# Patient Record
Sex: Female | Born: 1995 | Race: Black or African American | Hispanic: No | Marital: Single | Smoking: Never smoker
Health system: Southern US, Community
[De-identification: ages and names within clinical notes are randomized; demographics above are authoritative.]

## PROBLEM LIST (undated history)

## (undated) DIAGNOSIS — J45909 Unspecified asthma, uncomplicated: Secondary | ICD-10-CM

## (undated) HISTORY — PX: TONSILLECTOMY: SUR1361

---

## 2017-12-29 ENCOUNTER — Encounter (HOSPITAL_COMMUNITY): Payer: Self-pay

## 2017-12-29 ENCOUNTER — Other Ambulatory Visit: Payer: Self-pay

## 2017-12-29 ENCOUNTER — Emergency Department (HOSPITAL_COMMUNITY)
Admission: EM | Admit: 2017-12-29 | Discharge: 2017-12-29 | Disposition: A | Discharge status: Home / Self Care | Payer: Self-pay | Attending: Emergency Medicine | Admitting: Emergency Medicine

## 2017-12-29 ENCOUNTER — Emergency Department (HOSPITAL_COMMUNITY): Payer: Self-pay

## 2017-12-29 DIAGNOSIS — M79641 Pain in right hand: Secondary | ICD-10-CM | POA: Insufficient documentation

## 2017-12-29 DIAGNOSIS — M79642 Pain in left hand: Secondary | ICD-10-CM | POA: Insufficient documentation

## 2017-12-29 DIAGNOSIS — J45909 Unspecified asthma, uncomplicated: Secondary | ICD-10-CM | POA: Insufficient documentation

## 2017-12-29 DIAGNOSIS — Z79899 Other long term (current) drug therapy: Secondary | ICD-10-CM | POA: Insufficient documentation

## 2017-12-29 HISTORY — DX: Unspecified asthma, uncomplicated: J45.909

## 2017-12-29 MED ORDER — ACETAMINOPHEN 500 MG PO TABS
1000.0000 mg | ORAL_TABLET | Freq: Once | ORAL | Status: AC
  Administered 2017-12-29: 1000 mg via ORAL
  Filled 2017-12-29: qty 2

## 2017-12-29 NOTE — ED Notes (Signed)
ED Provider at bedside. 

## 2017-12-29 NOTE — ED Notes (Signed)
Patient verbalizes understanding of discharge instructions. Opportunity for questioning and answers were provided. Armband removed by staff, pt discharged from ED. Pt ambulatory to the lobby.  

## 2017-12-29 NOTE — ED Provider Notes (Signed)
[facility] EMERGENCY DEPARTMENT Provider Note   CSN: [id number] Arrival date & time: 12/29/17  1453     History   Chief Complaint Chief Complaint  Patient presents with  . Hand Pain    HPI Sheri Zimmerman is a 22 y.o. female for evaluation of pain in the bilateral hand that is been ongoing for the last 2 weeks.  He states that most the pain is on the dorsal aspect of hand and in her fingers.  She states that the pain is worse with movement and bending her fingers.  She is noticed some swelling about a week ago but states it is since resolved.  No overlying warmth, erythema.  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  She states that while she is working, the pain is slightly better but when she is at home and resting, she feels "aching all in her hands."  She does not know of any specific trauma, injury, fall.  She has been taking Tylenol intermittently with minimal improvement.  She states that she did not does not have a primary care doctor to follow-up with she came into the ED today because she had time.  She does report that grandmother has a history of rheumatoid arthritis.  Patient denies any fevers.  Reports she does occasionally have some presenting sensation to bilateral tips of fingers currently denies any numbness or weakness.  The history is provided by the patient.    Past Medical History:  Diagnosis Date  . Asthma     There are no active problems to display for this patient.   History reviewed. No pertinent surgical history.   OB History   None      Home Medications    Prior to Admission medications   Medication Sig Start Date End Date Taking? Authorizing Provider  acetaminophen (TYLENOL) 325 MG tablet Take 650 mg by mouth every 3 (three) hours as needed (for hand-related pain).    Yes [provider]  etonogestrel (NEXPLANON) 68 MG IMPL implant 1 each by Subdermal route once.    Yes [provider]  naproxen sodium (ALEVE) 220 MG tablet Take 440 mg by mouth 2 (two) times daily as needed (for hand-related pain).    Yes [provider]    Family History No family history on file.  Social History Social History   Tobacco Use  . Smoking status: Not on file  Substance Use Topics  . Alcohol use: Not on file  . Drug use: Not on file     Allergies   Patient has no known allergies.   Review of Systems Review of Systems  Constitutional: Negative for fever.  Musculoskeletal:       Bilateral hand pain  Skin: Negative for color change.  Neurological: Negative for weakness and numbness.  All other systems reviewed and are negative.    Physical Exam Updated Vital Signs BP 117/72 (BP Location: Right Arm)   Pulse 64   Temp 98.6 F (37 C) (Oral)   Resp 15   Ht 5\' 1"  (1.549 m)   Wt 47.6 kg   SpO2 100%   BMI 19.84 kg/m   Physical Exam  Constitutional: She appears well-developed and well-nourished.  HENT:  Head: Normocephalic and atraumatic.  Eyes: Conjunctivae and EOM are normal. Right eye exhibits no discharge. Left eye exhibits no discharge. No scleral icterus.  Cardiovascular:  Pulses:      Radial  pulses are 2+ on the right side, and 2+ on the left side.  Pulmonary/Chest: Effort normal.  Musculoskeletal:  Diffuse tenderness palpation dorsal aspect of the right hand.  No deformity or crepitus noted.  No overlying warmth, erythema, edema.  Flexion/extension of all 5 digits intact without any difficulty.  Additionally can easily make a fist.  Flexion/extension of right wrist intact without any difficulty.  No tenderness palpation of the right wrist, right elbow, right shoulder.  Diffuse tenderness palpation of the dorsal aspect of the left hand.  No deformity or crepitus noted.  No overlying warmth, erythema, edema.  Flexion/extension of all 5 digits intact without any difficulty.  Patient can usually make a fist without any  difficulty.  Flexion/extension of left wrist intact without any difficulty.  No tenderness palpation of the wrist, left elbow, left shoulder. Positive Phalen's test. Negative Tinnel's sign.   Neurological: She is alert.  Equal grip strength bilaterally. 5/5 strength of BUE  Skin: Skin is warm and dry. Capillary refill takes less than 2 seconds.  Good distal cap refill. BUE are not dusky in appearance or cool to touch.  Psychiatric: She has a normal mood and affect. Her speech is normal and behavior is normal.  Nursing note and vitals reviewed.    ED Treatments / Results  Labs (all labs ordered are listed, but only abnormal results are displayed) Labs Reviewed - No data to display  EKG None  Radiology Dg Hand Complete Left  Result Date: 12/29/2017 CLINICAL DATA:  Hand pain for 2 weeks.  No known trauma. EXAM: LEFT HAND - COMPLETE 3+ VIEW COMPARISON:  None. FINDINGS: There is no evidence of fracture or dislocation. There is no evidence of arthropathy or other focal bone abnormality. Soft tissues are unremarkable. IMPRESSION: Negative. Electronically Signed   By: Signa Kell M.D.   On: 12/29/2017 16:18   Dg Hand Complete Right  Result Date: 12/29/2017 CLINICAL DATA:  Hand pain. EXAM: RIGHT HAND - COMPLETE 3+ VIEW COMPARISON:  No prior FINDINGS: No acute bony or joint abnormality identified. No evidence of inflammatory arthropathy. No evidence of fracture or dislocation. IMPRESSION: No acute abnormality. Electronically Signed   By: Maisie Fus  Register   On: 12/29/2017 16:19    Procedures Procedures (including critical care time)  Medications Ordered in ED Medications  acetaminophen (TYLENOL) tablet 1,000 mg (1,000 mg Oral Given 12/29/17 1523)     Initial Impression / Assessment and Plan / ED Course  I have reviewed the triage vital signs and the nursing notes.  Pertinent labs & imaging results that were available during my care of the patient were reviewed by me and considered  in my medical decision making (see chart for details).     22 year old female who presents for evaluation of 2 weeks of bilateral hand pain that she describes as a "aching."  Adamantly reports some tingling sensation to the tips of her fingers but currently denies any numbness/weakness.  No overlying warmth, erythema.  Grandmother does have a history of rheumatoid arthritis.  Patient does not know she has any immune diseases.  No fevers. Patient is afebrile, non-toxic appearing, sitting comfortably on examination table. Vital signs reviewed and stable. Patient is neurovascularly intact.  Consider generalized myalgias msk pain vs carpal tunnel versus autoimmune etiology.  History/physical exam is not concerning for septic arthritis, acute arterial embolism, flexor tenosynovitis. Check x-rays for any bony abnormality.  XRs reviewed. Negative for any acute abnormality.   Discussed results with patient. Given positive phalen's sign,  will plan to treat as possible carpal tunnel. Patient placed in bilateral wrist splints. Additionally, referred patient to outpatient [facility] for established primary care. Instructed her to have them test for autoimmune given family history of RA. At this time, patient exhibits no emergent life-threatening condition that require further evaluation in ED. Patient had ample opportunity for questions and discussion. All patient's questions were answered with full understanding. Strict return precautions discussed. Patient expresses understanding and agreement to plan.    Final Clinical Impressions(s) / ED Diagnoses   Final diagnoses:  Bilateral hand pain    ED Discharge Orders    None       Rosana HoesLayden, Lindsey A, PA-C 12/29/17 1650    Gwyneth SproutPlunkett, Whitney, MD 12/30/17 1109

## 2017-12-29 NOTE — ED Triage Notes (Signed)
Pt endorses bilateral hand pain, worse in right x2 weeks. Pt works in [employer]. Pt denies injury. Pt states pain is 8/10 and worse in morning and night.

## 2017-12-29 NOTE — ED Notes (Signed)
Pt back from X-ray.  

## 2017-12-29 NOTE — ED Notes (Signed)
Patient transported to X-ray 

## 2017-12-29 NOTE — Discharge Instructions (Signed)
You can take Tylenol or Ibuprofen as directed for pain. You can alternate Tylenol and Ibuprofen every 4 hours. If you take Tylenol at 1pm, then you can take Ibuprofen at 5pm. Then you can take Tylenol again at 9pm.   Wear the splint for support and stabilization. Wear them especially work.  Follow-up with the referred [facility] doctor.  Return to the emergency department for worsening pain, fevers, discoloration of the fingertips or any

## 2018-04-12 ENCOUNTER — Ambulatory Visit: Payer: Self-pay | Admitting: Nurse Practitioner

## 2019-02-18 ENCOUNTER — Emergency Department (HOSPITAL_COMMUNITY)
Admission: EM | Admit: 2019-02-18 | Discharge: 2019-02-18 | Disposition: A | Discharge status: Home / Self Care | Payer: BC Managed Care – PPO | Attending: Emergency Medicine | Admitting: Emergency Medicine

## 2019-02-18 ENCOUNTER — Emergency Department (HOSPITAL_COMMUNITY): Payer: BC Managed Care – PPO

## 2019-02-18 ENCOUNTER — Other Ambulatory Visit: Payer: Self-pay

## 2019-02-18 ENCOUNTER — Encounter (HOSPITAL_COMMUNITY): Payer: Self-pay

## 2019-02-18 DIAGNOSIS — Y92410 Unspecified street and highway as the place of occurrence of the external cause: Secondary | ICD-10-CM | POA: Insufficient documentation

## 2019-02-18 DIAGNOSIS — J45909 Unspecified asthma, uncomplicated: Secondary | ICD-10-CM | POA: Diagnosis not present

## 2019-02-18 DIAGNOSIS — Y9389 Activity, other specified: Secondary | ICD-10-CM | POA: Insufficient documentation

## 2019-02-18 DIAGNOSIS — M21822 Other specified acquired deformities of left upper arm: Secondary | ICD-10-CM | POA: Diagnosis not present

## 2019-02-18 DIAGNOSIS — Y999 Unspecified external cause status: Secondary | ICD-10-CM | POA: Insufficient documentation

## 2019-02-18 DIAGNOSIS — M25512 Pain in left shoulder: Secondary | ICD-10-CM | POA: Diagnosis present

## 2019-02-18 DIAGNOSIS — Z79899 Other long term (current) drug therapy: Secondary | ICD-10-CM | POA: Insufficient documentation

## 2019-02-18 MED ORDER — METHOCARBAMOL 500 MG PO TABS: 500.0000 mg | ORAL_TABLET | Freq: Two times a day (BID) | ORAL | 0 refills | Status: DC
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
## 2021-07-16 ENCOUNTER — Ambulatory Visit
Admission: RE | Admit: 2021-07-16 | Discharge: 2021-07-16 | Disposition: A | Discharge status: Home / Self Care | Payer: BC Managed Care – PPO | Source: Ambulatory Visit | Attending: Physician Assistant | Admitting: Physician Assistant

## 2021-07-16 VITALS — BP 109/73 | HR 75 | Temp 98.4°F | Resp 16

## 2021-07-16 DIAGNOSIS — A5901 Trichomonal vulvovaginitis: Secondary | ICD-10-CM | POA: Diagnosis not present

## 2021-07-16 DIAGNOSIS — T192XXA Foreign body in vulva and vagina, initial encounter: Secondary | ICD-10-CM | POA: Insufficient documentation

## 2021-07-16 DIAGNOSIS — N760 Acute vaginitis: Secondary | ICD-10-CM | POA: Insufficient documentation

## 2021-07-16 MED ORDER — ALBUTEROL SULFATE HFA 108 (90 BASE) MCG/ACT IN AERS: 2.0000 | INHALATION_SPRAY | Freq: Four times a day (QID) | RESPIRATORY_TRACT | 2 refills | Status: AC | PRN

## 2021-07-16 NOTE — ED Provider Notes (Addendum)
[facility]    CSN: [id number] Arrival date & time: 07/16/21  5625      History   Chief Complaint Chief Complaint  Patient presents with   SEXUALLY TRANSMITTED DISEASE    Entered by patient    HPI Sheri Zimmerman is a 26 y.o. female.   Pt complains of a condom stick in vaginal vault.  Pt also request std testing.  No symptoms   The history is provided by the patient. No language interpreter was used.  Foreign Body in Vagina This is a new problem. The current episode started yesterday. The problem has not changed since onset.Pertinent negatives include no abdominal pain. She has tried nothing for the symptoms. The treatment provided no relief.    Past Medical History:  Diagnosis Date   Asthma     There are no problems to display for this patient.   Past Surgical History:  Procedure Laterality Date   TONSILLECTOMY      OB History     Gravida  0   Para  0   Term  0   Preterm  0   AB  0   Living  0      SAB  0   IAB  0   Ectopic  0   Multiple  0   Live Births  0            Home Medications    Prior to Admission medications   Medication Sig Start Date End Date Taking? Authorizing Provider  acetaminophen (TYLENOL) 325 MG tablet Take 650 mg by mouth every 3 (three) hours as needed (for hand-related pain).     [provider]  etonogestrel (NEXPLANON) 68 MG IMPL implant 1 each by Subdermal route once.    [provider]  methocarbamol (ROBAXIN) 500 MG tablet Take 1 tablet (500 mg total) by mouth 2 (two) times daily. 02/18/19   Khatri, Hina, PA-C  naproxen sodium (ALEVE) 220 MG tablet Take 440 mg by mouth 2 (two) times daily as needed (for hand-related pain).     [provider]    Family History History reviewed. No pertinent family history.  Social History Social History   Tobacco Use   Smoking status: Former    Types: Cigarettes   Smokeless tobacco: Never  Vaping Use   Vaping Use: Never used   Substance Use Topics   Alcohol use: Yes    Comment: ocassionally   Drug use: Never     Allergies   Patient has no known allergies.   Review of Systems Review of Systems  Gastrointestinal:  Negative for abdominal pain.  Genitourinary:  Negative for vaginal discharge and vaginal pain.  All other systems reviewed and are negative.    Physical Exam Triage Vital Signs ED Triage Vitals [07/16/21 0847]  Enc Vitals Group     BP 109/73     Pulse Rate 75     Resp 16     Temp 98.4 F (36.9 C)     Temp Source Oral     SpO2 95 %     Weight      Height      Head Circumference      Peak Flow      Pain Score 0     Pain Loc      Pain Edu?      Excl. in GC?    No data found.  Updated Vital Signs BP 109/73 (BP Location: Right Arm)  Pulse 75   Temp 98.4 F (36.9 C) (Oral)   Resp 16   LMP 07/02/2021   SpO2 95%   Visual Acuity Right Eye Distance:   Left Eye Distance:   Bilateral Distance:    Right Eye Near:   Left Eye Near:    Bilateral Near:     Physical Exam Vitals and nursing note reviewed.  Constitutional:      Appearance: She is well-developed.  Eyes:     Pupils: Pupils are equal, round, and reactive to light.  Cardiovascular:     Rate and Rhythm: Normal rate.  Pulmonary:     Effort: Pulmonary effort is normal.  Abdominal:     General: There is no distension.  Genitourinary:    Comments: Condom vaginal vault, removed with speculem and fox swab.  Neurological:     Mental Status: She is alert and oriented to person, place, and time.  Psychiatric:        Mood and Affect: Mood normal.      UC Treatments / Results  Labs (all labs ordered are listed, but only abnormal results are displayed) Labs Reviewed  CERVICOVAGINAL ANCILLARY ONLY    EKG   Radiology No results found.  Procedures Procedures (including critical care time)  Medications Ordered in UC Medications - No data to display  Initial Impression / Assessment and Plan / UC Course   I have reviewed the triage vital signs and the nursing notes.  Pertinent labs & imaging results that were available during my care of the patient were reviewed by me and considered in my medical decision making (see chart for details). Pt request an albuterol inhaler.  Final Clinical Impressions(s) / UC Diagnoses   Final diagnoses:  Retained foreign body of vagina, initial encounter     Discharge Instructions      Return if any problems.    ED Prescriptions   None    PDMP not reviewed this encounter.   Elson Areas, PA-C 07/16/21 9292    Elson Areas, PA-C 07/16/21 1126

## 2021-07-16 NOTE — Discharge Instructions (Addendum)
Return if any problems.

## 2021-07-16 NOTE — ED Triage Notes (Signed)
Pt states that after intercourse last pm they could not find the condom. Pt states she also wants to be checked for STD. Denies discharge.

## 2021-07-20 LAB — CERVICOVAGINAL ANCILLARY ONLY
Bacterial Vaginitis (gardnerella): POSITIVE — AB
Candida Glabrata: NEGATIVE
Candida Vaginitis: NEGATIVE
Chlamydia: NEGATIVE
Comment: NEGATIVE
Comment: NEGATIVE
Comment: NEGATIVE
Comment: NEGATIVE
Comment: NEGATIVE
Comment: NORMAL
Neisseria Gonorrhea: NEGATIVE
Trichomonas: POSITIVE — AB

## 2021-07-21 ENCOUNTER — Telehealth (HOSPITAL_COMMUNITY): Payer: Self-pay | Admitting: Emergency Medicine

## 2021-07-21 ENCOUNTER — Telehealth: Payer: Self-pay

## 2021-07-21 MED ORDER — METRONIDAZOLE 500 MG PO TABS: 500.0000 mg | ORAL_TABLET | Freq: Two times a day (BID) | ORAL | 0 refills | Status: DC

## 2021-07-21 NOTE — ED Notes (Signed)
Patient verification complete (name and date of birth).  Patient in clinic requesting to get medication prescribed today sent to another pharmacy. Medication sent and the patient is aware.

## 2021-08-10 ENCOUNTER — Ambulatory Visit
Admission: RE | Admit: 2021-08-10 | Discharge: 2021-08-10 | Disposition: A | Discharge status: Home / Self Care | Payer: BC Managed Care – PPO | Source: Ambulatory Visit | Attending: Urgent Care | Admitting: Urgent Care

## 2021-08-10 VITALS — BP 115/72 | HR 76 | Temp 98.6°F | Resp 20

## 2021-08-10 DIAGNOSIS — Z113 Encounter for screening for infections with a predominantly sexual mode of transmission: Secondary | ICD-10-CM | POA: Insufficient documentation

## 2021-08-10 DIAGNOSIS — Z8619 Personal history of other infectious and parasitic diseases: Secondary | ICD-10-CM | POA: Insufficient documentation

## 2021-08-10 DIAGNOSIS — Z8742 Personal history of other diseases of the female genital tract: Secondary | ICD-10-CM | POA: Insufficient documentation

## 2021-08-10 NOTE — ED Provider Notes (Signed)
[facility]   MRN: [id number] DOB: 1995-06-23  Subjective:   Sheri Zimmerman is a 26 y.o. female presenting for retesting for BV and trichomoniasis.  Patient was treated in early June for this.  She completed her antibiotic course more than 2 weeks ago.  Denies fever, nausea, vomiting, abdominal or pelvic pain, vaginal discharge, urinary symptoms.  She simply wants to make sure she does not have these infections still.  She did complete the course of metronidazole.  No current facility-administered medications for this encounter.  Current Outpatient Medications:    acetaminophen (TYLENOL) 325 MG tablet, Take 650 mg by mouth every 3 (three) hours as needed (for hand-related pain). , Disp: , Rfl:    albuterol (VENTOLIN HFA) 108 (90 Base) MCG/ACT inhaler, Inhale 2 puffs into the lungs every 6 (six) hours as needed for wheezing or shortness of breath., Disp: 8 g, Rfl: 2   etonogestrel (NEXPLANON) 68 MG IMPL implant, 1 each by Subdermal route once., Disp: , Rfl:    methocarbamol (ROBAXIN) 500 MG tablet, Take 1 tablet (500 mg total) by mouth 2 (two) times daily., Disp: 20 tablet, Rfl: 0   metroNIDAZOLE (FLAGYL) 500 MG tablet, Take 1 tablet (500 mg total) by mouth 2 (two) times daily., Disp: 14 tablet, Rfl: 0   naproxen sodium (ALEVE) 220 MG tablet, Take 440 mg by mouth 2 (two) times daily as needed (for hand-related pain). , Disp: , Rfl:    No Known Allergies  Past Medical History:  Diagnosis Date   Asthma      Past Surgical History:  Procedure Laterality Date   TONSILLECTOMY      History reviewed. No pertinent family history.  Social History   Tobacco Use   Smoking status: Former    Types: Cigarettes   Smokeless tobacco: Never  Vaping Use   Vaping Use: Never used  Substance Use Topics   Alcohol use: Yes    Comment: ocassionally   Drug use: Never    ROS   Objective:   Vitals: BP 115/72   Pulse 76   Temp 98.6 F (37 C)   Resp 20   LMP  07/02/2021   SpO2 98%   Physical Exam Constitutional:      General: She is not in acute distress.    Appearance: Normal appearance. She is well-developed. She is not ill-appearing, toxic-appearing or diaphoretic.  HENT:     Head: Normocephalic and atraumatic.     Nose: Nose normal.     Mouth/Throat:     Mouth: Mucous membranes are moist.  Eyes:     General: No scleral icterus.       Right eye: No discharge.        Left eye: No discharge.     Extraocular Movements: Extraocular movements intact.  Cardiovascular:     Rate and Rhythm: Normal rate.  Pulmonary:     Effort: Pulmonary effort is normal.  Skin:    General: Skin is warm and dry.  Neurological:     General: No focal deficit present.     Mental Status: She is alert and oriented to person, place, and time.  Psychiatric:        Mood and Affect: Mood normal.        Behavior: Behavior normal.     Assessment and Plan :   PDMP not reviewed this encounter.  1. History of trichomoniasis   2. History of vaginitis   3. Screen for STD (sexually transmitted disease)  Recheck for trichomoniasis and bacterial vaginosis pending.  We will treat as appropriate based off of lab results.   Wallis Bamberg, PA-C 08/10/21 1026

## 2021-08-10 NOTE — ED Triage Notes (Addendum)
Pt here for follow-up testing of BV and trich after receiving treatment 3 weeks ago. No sxs.

## 2021-08-12 ENCOUNTER — Telehealth (HOSPITAL_COMMUNITY): Payer: Self-pay | Admitting: Emergency Medicine

## 2021-08-12 LAB — CERVICOVAGINAL ANCILLARY ONLY
Bacterial Vaginitis (gardnerella): NEGATIVE
Chlamydia: POSITIVE — AB
Comment: NEGATIVE
Comment: NEGATIVE
Comment: NEGATIVE
Comment: NORMAL
Neisseria Gonorrhea: NEGATIVE
Trichomonas: NEGATIVE

## 2021-08-12 MED ORDER — DOXYCYCLINE HYCLATE 100 MG PO CAPS: 100.0000 mg | ORAL_CAPSULE | Freq: Two times a day (BID) | ORAL | 0 refills | Status: AC

## 2021-09-25 ENCOUNTER — Ambulatory Visit: Payer: BC Managed Care – PPO

## 2021-11-19 ENCOUNTER — Ambulatory Visit
Admission: RE | Admit: 2021-11-19 | Discharge: 2021-11-19 | Disposition: A | Discharge status: Home / Self Care | Payer: BC Managed Care – PPO | Source: Ambulatory Visit | Attending: Emergency Medicine | Admitting: Emergency Medicine

## 2021-11-19 VITALS — BP 123/84 | HR 65 | Temp 98.2°F | Resp 16

## 2021-11-19 DIAGNOSIS — Z202 Contact with and (suspected) exposure to infections with a predominantly sexual mode of transmission: Secondary | ICD-10-CM | POA: Diagnosis not present

## 2021-11-19 DIAGNOSIS — N760 Acute vaginitis: Secondary | ICD-10-CM | POA: Diagnosis not present

## 2021-11-19 DIAGNOSIS — Z113 Encounter for screening for infections with a predominantly sexual mode of transmission: Secondary | ICD-10-CM | POA: Diagnosis present

## 2021-11-19 LAB — POCT URINE PREGNANCY: Preg Test, Ur: NEGATIVE

## 2021-11-19 NOTE — Discharge Instructions (Signed)
The results of your vaginal swab test which screens for BV, yeast, gonorrhea, chlamydia and trichomonas will be made posted to your MyChart account once it is complete.  This typically takes 2 to 4 days.  Please abstain from sexual intercourse of any kind, vaginal, oral or anal, until you have received the results of your STD testing.     If any of your results are abnormal, you will receive a phone call regarding treatment.  Prescriptions, if any are needed, will be provided for you at your pharmacy.     Your urine pregnancy test today is negative.   Thank you for visiting urgent care today.  I appreciate the opportunity to participate in your care.

## 2021-11-19 NOTE — ED Provider Notes (Signed)
[facility]    CSN: [id number] Arrival date & time: 11/19/21  1619    HISTORY   Chief Complaint  Patient presents with   SEXUALLY TRANSMITTED DISEASE    Entered by patient   HPI Sheri Zimmerman is a pleasant, 26 y.o. female who presents to urgent care today. The patient is requesting to be tested for STDs.  Patient endorses abnormal vaginal odor.  Patient denies abnormal odor of urine, burning with urination, increased frequency of urination, suprapubic pain, perineal pain, flank pain, fever, chills, malaise, rigors, significant fatigue, vaginal itching, vaginal irritation, dyspareunia, genital lesion(s), known exposure to STD, and possible exposure to STD.       Past Medical History:  Diagnosis Date   Asthma    There are no problems to display for this patient.  Past Surgical History:  Procedure Laterality Date   TONSILLECTOMY     OB History     Gravida  0   Para  0   Term  0   Preterm  0   AB  0   Living  0      SAB  0   IAB  0   Ectopic  0   Multiple  0   Live Births  0          Home Medications    Prior to Admission medications   Medication Sig Start Date End Date Taking? Authorizing Provider  acetaminophen (TYLENOL) 325 MG tablet Take 650 mg by mouth every 3 (three) hours as needed (for hand-related pain).     [provider]  albuterol (VENTOLIN HFA) 108 (90 Base) MCG/ACT inhaler Inhale 2 puffs into the lungs every 6 (six) hours as needed for wheezing or shortness of breath. 07/16/21   Elson Areas, PA-C  naproxen sodium (ALEVE) 220 MG tablet Take 440 mg by mouth 2 (two) times daily as needed (for hand-related pain).     [provider]    Family History History reviewed. No pertinent family history. Social History Social History   Tobacco Use   Smoking status: Former    Types: Cigarettes   Smokeless tobacco: Never  Vaping Use   Vaping Use: Never used  Substance Use Topics   Alcohol use: Yes     Comment: ocassionally   Drug use: Never   Allergies   Patient has no known allergies.  Review of Systems Review of Systems Pertinent findings revealed after performing a 14 point review of systems has been noted in the history of present illness.  Physical Exam Triage Vital Signs ED Triage Vitals  Enc Vitals Group     BP 12/05/20 0827 (!) 147/82     Pulse Rate 12/05/20 0827 72     Resp 12/05/20 0827 18     Temp 12/05/20 0827 98.3 F (36.8 C)     Temp Source 12/05/20 0827 Oral     SpO2 12/05/20 0827 98 %     Weight --      Height --      Head Circumference --      Peak Flow --      Pain Score 12/05/20 0826 5     Pain Loc --      Pain Edu? --      Excl. in GC? --   No data found.  Updated Vital Signs BP 123/84 (BP Location: Right Arm)   Pulse 65   Temp 98.2 F (36.8 C) (Oral)   Resp 16   LMP  11/06/2021   SpO2 98%   Physical Exam Vitals and nursing note reviewed.  Constitutional:      General: She is not in acute distress.    Appearance: Normal appearance. She is not ill-appearing.  HENT:     Head: Normocephalic and atraumatic.  Eyes:     General: Lids are normal.        Right eye: No discharge.        Left eye: No discharge.     Extraocular Movements: Extraocular movements intact.     Conjunctiva/sclera: Conjunctivae normal.     Right eye: Right conjunctiva is not injected.     Left eye: Left conjunctiva is not injected.  Neck:     Trachea: Trachea and phonation normal.  Cardiovascular:     Rate and Rhythm: Normal rate and regular rhythm.     Pulses: Normal pulses.     Heart sounds: Normal heart sounds. No murmur heard.    No friction rub. No gallop.  Pulmonary:     Effort: Pulmonary effort is normal. No accessory muscle usage, prolonged expiration or respiratory distress.     Breath sounds: Normal breath sounds. No stridor, decreased air movement or transmitted upper airway sounds. No decreased breath sounds, wheezing, rhonchi or rales.  Chest:      Chest wall: No tenderness.  Genitourinary:    Comments: Patient politely declines pelvic exam today, patient provided a vaginal swab for testing. Musculoskeletal:        General: Normal range of motion.     Cervical back: Normal range of motion and neck supple. Normal range of motion.  Lymphadenopathy:     Cervical: No cervical adenopathy.  Skin:    General: Skin is warm and dry.     Findings: No erythema or rash.  Neurological:     General: No focal deficit present.     Mental Status: She is alert and oriented to person, place, and time.  Psychiatric:        Mood and Affect: Mood normal.        Behavior: Behavior normal.     Visual Acuity Right Eye Distance:   Left Eye Distance:   Bilateral Distance:    Right Eye Near:   Left Eye Near:    Bilateral Near:     [facility] Couse / Diagnostics / Procedures:     Radiology No results found.  Procedures Procedures (including critical care time) EKG  Pending results:  Labs Reviewed  POCT URINE PREGNANCY  CERVICOVAGINAL ANCILLARY ONLY    Medications Ordered in UC: Medications - No data to display  UC Diagnoses / Final Clinical Impressions(s)   I have reviewed the triage vital signs and the nursing notes.  Pertinent labs & imaging results that were available during my care of the patient were reviewed by me and considered in my medical decision making (see chart for details).    Final diagnoses:  Screening examination for STD (sexually transmitted disease)    STD screening was performed, patient advised that the results be posted to their MyChart and if any of the results are positive, they will be notified by phone, further treatment will be provided as indicated based on results of STD screening. Patient was advised to abstain from sexual intercourse until that they receive the results of their STD testing.  Patient was also advised to use condoms to protect themselves from STD exposure. Urine pregnancy test was  negative. Return precautions advised.  Drug allergies reviewed, all questions addressed.  ED Prescriptions   None    PDMP not reviewed this encounter.  Disposition Upon Discharge:  Condition: stable for discharge home  Patient presented with concern for an acute illness with associated systemic symptoms and significant discomfort requiring urgent management. In my opinion, this is a condition that a prudent lay person (someone who possesses an average knowledge of health and medicine) may potentially expect to result in complications if not addressed urgently such as respiratory distress, impairment of bodily function or dysfunction of bodily organs.   As such, the patient has been evaluated and assessed, work-up was performed and treatment was provided in alignment with urgent care protocols and evidence based medicine.  Patient/parent/caregiver has been advised that the patient may require follow up for further testing and/or treatment if the symptoms continue in spite of treatment, as clinically indicated and appropriate.  Routine symptom specific, illness specific and/or disease specific instructions were discussed with the patient and/or caregiver at length.  Prevention strategies for avoiding STD exposure were also discussed.  The patient will follow up with their current PCP if and as advised. If the patient does not currently have a PCP we will assist them in obtaining one.   The patient may need specialty follow up if the symptoms continue, in spite of conservative treatment and management, for further workup, evaluation, consultation and treatment as clinically indicated and appropriate.  Patient/parent/caregiver verbalized understanding and agreement of plan as discussed.  All questions were addressed during visit.  Please see discharge instructions below for further details of plan.  Discharge Instructions:   Discharge Instructions      The results of your vaginal swab  test which screens for BV, yeast, gonorrhea, chlamydia and trichomonas will be made posted to your MyChart account once it is complete.  This typically takes 2 to 4 days.  Please abstain from sexual intercourse of any kind, vaginal, oral or anal, until you have received the results of your STD testing.     If any of your results are abnormal, you will receive a phone call regarding treatment.  Prescriptions, if any are needed, will be provided for you at your pharmacy.     Your urine pregnancy test today is negative.   Thank you for visiting urgent care today.  I appreciate the opportunity to participate in your care.       This office note has been dictated using Teaching laboratory technician.  Unfortunately, this method of dictation can sometimes lead to typographical or grammatical errors.  I apologize for your inconvenience in advance if this occurs.  Please do not hesitate to reach out to me if clarification is needed.       Theadora Rama Scales, PA-C 11/19/21 1649

## 2021-11-19 NOTE — ED Triage Notes (Signed)
The patient is requesting to be tested for STDs, she states she does have a vaginal odor that began this week.

## 2021-11-20 LAB — CERVICOVAGINAL ANCILLARY ONLY
Bacterial Vaginitis (gardnerella): POSITIVE — AB
Candida Glabrata: NEGATIVE
Candida Vaginitis: NEGATIVE
Chlamydia: NEGATIVE
Comment: NEGATIVE
Comment: NEGATIVE
Comment: NEGATIVE
Comment: NEGATIVE
Comment: NEGATIVE
Comment: NORMAL
Neisseria Gonorrhea: NEGATIVE
Trichomonas: NEGATIVE

## 2021-11-23 ENCOUNTER — Telehealth (HOSPITAL_COMMUNITY): Payer: Self-pay

## 2021-11-23 MED ORDER — METRONIDAZOLE 500 MG PO TABS: 500.0000 mg | ORAL_TABLET | Freq: Two times a day (BID) | ORAL | 0 refills | Status: DC

## 2022-04-05 ENCOUNTER — Ambulatory Visit
Admission: EM | Admit: 2022-04-05 | Discharge: 2022-04-05 | Disposition: A | Discharge status: Home / Self Care | Payer: BC Managed Care – PPO | Attending: Urgent Care | Admitting: Urgent Care

## 2022-04-05 DIAGNOSIS — B9689 Other specified bacterial agents as the cause of diseases classified elsewhere: Secondary | ICD-10-CM | POA: Insufficient documentation

## 2022-04-05 DIAGNOSIS — B3731 Acute candidiasis of vulva and vagina: Secondary | ICD-10-CM | POA: Diagnosis not present

## 2022-04-05 DIAGNOSIS — N898 Other specified noninflammatory disorders of vagina: Secondary | ICD-10-CM | POA: Diagnosis not present

## 2022-04-05 DIAGNOSIS — Z113 Encounter for screening for infections with a predominantly sexual mode of transmission: Secondary | ICD-10-CM | POA: Insufficient documentation

## 2022-04-05 DIAGNOSIS — N760 Acute vaginitis: Secondary | ICD-10-CM | POA: Insufficient documentation

## 2022-04-05 DIAGNOSIS — M79671 Pain in right foot: Secondary | ICD-10-CM | POA: Diagnosis not present

## 2022-04-05 MED ORDER — NAPROXEN 375 MG PO TABS: 375.0000 mg | ORAL_TABLET | Freq: Two times a day (BID) | ORAL | 0 refills | Status: DC

## 2022-04-05 NOTE — ED Triage Notes (Signed)
Pt c/o pain to right foot x 4 days-denies injury-also requesting STD screen-c/o vaginal d/c that she self dx as yeast and treated with boric acid-NAD-steady gait

## 2022-04-05 NOTE — ED Provider Notes (Signed)
[facility]  Note:  This document was prepared using Systems analyst and may include unintentional dictation errors.  MRN: [id number] DOB: 1995-10-04  Subjective:   Sheri Zimmerman is a 27 y.o. female presenting for 4-day history of unprovoked right first MTP pain over the plantar surface.  No swelling, trauma, fall, redness, wounds.  Patient does a lot of walking when she is at school for physical therapy.  She has a primarily sedentary job.  Would also like to be checked for vaginal discharge.  Has had several day history of whitish discharge.  She did take some boric acid suspecting that she had a yeast infection.  This did not make a difference for her. Denies fever, n/v, abdominal pain, pelvic pain, rashes, dysuria, urinary frequency, hematuria.    No current facility-administered medications for this encounter.  Current Outpatient Medications:    acetaminophen (TYLENOL) 325 MG tablet, Take 650 mg by mouth every 3 (three) hours as needed (for hand-related pain). , Disp: , Rfl:    albuterol (VENTOLIN HFA) 108 (90 Base) MCG/ACT inhaler, Inhale 2 puffs into the lungs every 6 (six) hours as needed for wheezing or shortness of breath., Disp: 8 g, Rfl: 2   metroNIDAZOLE (FLAGYL) 500 MG tablet, Take 1 tablet (500 mg total) by mouth 2 (two) times daily., Disp: 14 tablet, Rfl: 0   naproxen sodium (ALEVE) 220 MG tablet, Take 440 mg by mouth 2 (two) times daily as needed (for hand-related pain). , Disp: , Rfl:    No Known Allergies  Past Medical History:  Diagnosis Date   Asthma      Past Surgical History:  Procedure Laterality Date   TONSILLECTOMY      No family history on file.  Social History   Tobacco Use   Smoking status: Never   Smokeless tobacco: Never  Vaping Use   Vaping Use: Never used  Substance Use Topics   Alcohol use: Yes    Comment: occ   Drug use: Never    ROS   Objective:   Vitals: BP 115/76 (BP Location:  Right Arm)   Pulse 85   Temp 98.1 F (36.7 C) (Oral)   Resp 20   LMP 03/13/2022   SpO2 96%   Physical Exam Constitutional:      General: She is not in acute distress.    Appearance: Normal appearance. She is well-developed. She is not ill-appearing, toxic-appearing or diaphoretic.  HENT:     Head: Normocephalic and atraumatic.     Nose: Nose normal.     Mouth/Throat:     Mouth: Mucous membranes are moist.     Pharynx: Oropharynx is clear.  Eyes:     General: No scleral icterus.       Right eye: No discharge.        Left eye: No discharge.     Extraocular Movements: Extraocular movements intact.     Conjunctiva/sclera: Conjunctivae normal.  Cardiovascular:     Rate and Rhythm: Normal rate.  Pulmonary:     Effort: Pulmonary effort is normal.  Abdominal:     General: Bowel sounds are normal. There is no distension.     Palpations: Abdomen is soft. There is no mass.     Tenderness: There is no abdominal tenderness. There is no right CVA tenderness, left CVA tenderness, guarding or rebound.  Musculoskeletal:       Feet:  Skin:    General: Skin is warm and dry.  Neurological:  General: No focal deficit present.     Mental Status: She is alert and oriented to person, place, and time.  Psychiatric:        Mood and Affect: Mood normal.        Behavior: Behavior normal.        Thought Content: Thought content normal.        Judgment: Judgment normal.     Assessment and Plan :   PDMP not reviewed this encounter.  1. Right foot pain   2. Vaginal discharge   3. Acute vaginitis     Will manage for inflammatory right foot pain secondary to the extensive walking she does at school.  Recommended naproxen, will have her follow-up with podiatry, referral placed.  Vaginal swab results pending, will treat as appropriate based off of those results.  Counseled patient on potential for adverse effects with medications prescribed/recommended today, ER and return-to-clinic  precautions discussed, patient verbalized understanding.    Jaynee Eagles, [place] 04/05/22 1606

## 2022-04-07 ENCOUNTER — Telehealth: Payer: Self-pay | Admitting: Emergency Medicine

## 2022-04-07 LAB — CERVICOVAGINAL ANCILLARY ONLY
Bacterial Vaginitis (gardnerella): POSITIVE — AB
Candida Glabrata: NEGATIVE
Candida Vaginitis: POSITIVE — AB
Chlamydia: NEGATIVE
Comment: NEGATIVE
Comment: NEGATIVE
Comment: NEGATIVE
Comment: NEGATIVE
Comment: NEGATIVE
Comment: NORMAL
Neisseria Gonorrhea: NEGATIVE
Trichomonas: NEGATIVE

## 2022-04-07 MED ORDER — METRONIDAZOLE 0.75 % VA GEL: 1.0000 | Freq: Every day | VAGINAL | 0 refills | Status: AC

## 2022-04-07 MED ORDER — FLUCONAZOLE 150 MG PO TABS: 150.0000 mg | ORAL_TABLET | Freq: Once | ORAL | 0 refills | Status: AC

## 2022-04-12 ENCOUNTER — Ambulatory Visit (INDEPENDENT_AMBULATORY_CARE_PROVIDER_SITE_OTHER): Payer: BC Managed Care – PPO | Admitting: Podiatry

## 2022-04-12 DIAGNOSIS — Z91199 Patient's noncompliance with other medical treatment and regimen due to unspecified reason: Secondary | ICD-10-CM

## 2022-04-12 NOTE — Progress Notes (Signed)
   Complete physical exam  Patient: Sheri Zimmerman   DOB: 11/28/1996   27 y.o. Female  MRN: [id number]  Subjective:    No chief complaint on file.   Sheri Zimmerman is a 27 y.o. female who presents today for a complete physical exam. She reports consuming a {diet types:17450} diet. {types:19826} She generally feels {DESC; WELL/FAIRLY WELL/POORLY:18703}. She reports sleeping {DESC; WELL/FAIRLY WELL/POORLY:18703}. She {does/does not:200015} have additional problems to discuss today.    Most recent fall risk assessment:    08/05/2021   10:42 AM  Fall Risk   Falls in the past year? 0  Number falls in past yr: 0  Injury with Fall? 0  Risk for fall due to : No Fall Risks  Follow up Falls evaluation completed     Most recent depression screenings:    08/05/2021   10:42 AM 06/26/2020   10:46 AM  PHQ 2/9 Scores  PHQ - 2 Score 0 0  PHQ- 9 Score 5     {VISON DENTAL STD PSA (Optional):27386}  {History (Optional):23778}  Patient Care Team: Jessup, Joy, NP as PCP - General (Nurse Practitioner)   Outpatient Medications Prior to Visit  Medication Sig   fluticasone (FLONASE) 50 MCG/ACT nasal spray Place 2 sprays into both nostrils in the morning and at bedtime. After 7 days, reduce to once daily.   norgestimate-ethinyl estradiol (SPRINTEC 28) 0.25-35 MG-MCG tablet Take 1 tablet by mouth daily.   Nystatin POWD Apply liberally to affected area 2 times per day   spironolactone (ALDACTONE) 100 MG tablet Take 1 tablet (100 mg total) by mouth daily.   No facility-administered medications prior to visit.    ROS        Objective:     There were no vitals taken for this visit. {Vitals History (Optional):23777}  Physical Exam   No results found for any visits on 09/10/21. {Show previous labs (optional):23779}    Assessment & Plan:    Routine Health Maintenance and Physical Exam  Immunization History  Administered Date(s) Administered   DTaP 02/11/1999, 04/09/1999,  06/18/1999, 03/03/2000, 09/17/2003   Hepatitis A 07/14/2007, 07/19/2008   Hepatitis B 11/29/1998, 01/06/1999, 06/18/1999   HiB (PRP-OMP) 02/11/1999, 04/09/1999, 06/18/1999, 03/03/2000   IPV 02/11/1999, 04/09/1999, 12/07/1999, 09/17/2003   Influenza,inj,Quad PF,6+ Mos 10/19/2013   Influenza-Unspecified 01/19/2012   MMR 12/06/2000, 09/17/2003   Meningococcal Polysaccharide 07/19/2011   Pneumococcal Conjugate-13 03/03/2000   Pneumococcal-Unspecified 06/18/1999, 09/01/1999   Tdap 07/19/2011   Varicella 12/07/1999, 07/14/2007    Health Maintenance  Topic Date Due   HIV Screening  Never done   Hepatitis C Screening  Never done   INFLUENZA VACCINE  09/08/2021   PAP-Cervical Cytology Screening  09/10/2021 (Originally 11/28/2019)   PAP SMEAR-Modifier  09/10/2021 (Originally 11/28/2019)   TETANUS/TDAP  09/10/2021 (Originally 07/18/2021)   HPV VACCINES  Discontinued   COVID-19 Vaccine  Discontinued    Discussed health benefits of physical activity, and encouraged her to engage in regular exercise appropriate for her age and condition.  Problem List Items Addressed This Visit   None Visit Diagnoses     Annual physical exam    -  Primary   Cervical cancer screening       Need for Tdap vaccination          No follow-ups on file.     Joy Jessup, NP   

## 2022-09-02 ENCOUNTER — Ambulatory Visit: Payer: BC Managed Care – PPO

## 2022-09-06 ENCOUNTER — Ambulatory Visit
Admission: EM | Admit: 2022-09-06 | Discharge: 2022-09-06 | Disposition: A | Discharge status: Home / Self Care | Payer: BC Managed Care – PPO | Attending: Internal Medicine | Admitting: Internal Medicine

## 2022-09-06 DIAGNOSIS — N898 Other specified noninflammatory disorders of vagina: Secondary | ICD-10-CM | POA: Insufficient documentation

## 2022-09-06 DIAGNOSIS — Z9189 Other specified personal risk factors, not elsewhere classified: Secondary | ICD-10-CM | POA: Diagnosis present

## 2022-09-06 NOTE — ED Triage Notes (Signed)
Pt c/o vaginal d/c with odor x 4-5 days-requesting STD screening-NAD-steady gait

## 2022-09-06 NOTE — ED Provider Notes (Signed)
[facility]    CSN: [id number] Arrival date & time: 09/06/22  1628      History   Chief Complaint Chief Complaint  Patient presents with   SEXUALLY TRANSMITTED DISEASE   Vaginal Discharge    HPI Sheri Zimmerman is a 27 y.o. female.   Patient presents to urgent care for evaluation of vaginal odor and vaginal discharge that started 4-5 days. Vaginal discharge is minimal, white in color, and malodorous. No vaginal itching or rash. History of BV, states this feels similar. Sexually active with 1 female partner unprotected, unsure of any exposures to STD. Would like STD testing today. LMP 09/04/2022. Denies urinary symptoms, N/V, abdominal pain, pelvic pain, low back pain, or fever/chills. Has not attempted use of any OTC medications PTA.    Vaginal Discharge   Past Medical History:  Diagnosis Date   Asthma     There are no problems to display for this patient.   Past Surgical History:  Procedure Laterality Date   TONSILLECTOMY      OB History     Gravida  0   Para  0   Term  0   Preterm  0   AB  0   Living  0      SAB  0   IAB  0   Ectopic  0   Multiple  0   Live Births  0            Home Medications    Prior to Admission medications   Medication Sig Start Date End Date Taking? Authorizing Provider  acetaminophen (TYLENOL) 325 MG tablet Take 650 mg by mouth every 3 (three) hours as needed (for hand-related pain).     [provider]  albuterol (VENTOLIN HFA) 108 (90 Base) MCG/ACT inhaler Inhale 2 puffs into the lungs every 6 (six) hours as needed for wheezing or shortness of breath. 07/16/21   Elson Areas, PA-C  metroNIDAZOLE (FLAGYL) 500 MG tablet Take 1 tablet (500 mg total) by mouth 2 (two) times daily. 11/23/21   Merrilee Jansky, MD  naproxen (NAPROSYN) 375 MG tablet Take 1 tablet (375 mg total) by mouth 2 (two) times daily with a meal. 04/05/22   Wallis Bamberg, PA-C    Family History No family history on  file.  Social History Social History   Tobacco Use   Smoking status: Never   Smokeless tobacco: Never  Vaping Use   Vaping status: Never Used  Substance Use Topics   Alcohol use: Yes    Comment: occ   Drug use: Never     Allergies   Patient has no known allergies.   Review of Systems Review of Systems  Genitourinary:  Positive for vaginal discharge.  Per HPI   Physical Exam Triage Vital Signs ED Triage Vitals  Encounter Vitals Group     BP 09/06/22 1634 123/75     Systolic BP Percentile --      Diastolic BP Percentile --      Pulse Rate 09/06/22 1634 85     Resp 09/06/22 1634 18     Temp 09/06/22 1634 97.8 F (36.6 C)     Temp Source 09/06/22 1634 Oral     SpO2 09/06/22 1634 98 %     Weight --      Height --      Head Circumference --      Peak Flow --      Pain Score 09/06/22 1647 0  Pain Loc --      Pain Education --      Exclude from Growth Chart --    No data found.  Updated Vital Signs BP 123/75 (BP Location: Right Arm)   Pulse 85   Temp 97.8 F (36.6 C) (Oral)   Resp 18   LMP 09/04/2022   SpO2 98%   Visual Acuity Right Eye Distance:   Left Eye Distance:   Bilateral Distance:    Right Eye Near:   Left Eye Near:    Bilateral Near:     Physical Exam Vitals and nursing note reviewed.  Constitutional:      Appearance: She is not ill-appearing or toxic-appearing.  HENT:     Head: Normocephalic and atraumatic.     Right Ear: Hearing and external ear normal.     Left Ear: Hearing and external ear normal.     Nose: Nose normal.     Mouth/Throat:     Lips: Pink.  Eyes:     General: Lids are normal. Vision grossly intact. Gaze aligned appropriately.     Extraocular Movements: Extraocular movements intact.     Conjunctiva/sclera: Conjunctivae normal.  Pulmonary:     Effort: Pulmonary effort is normal.  Genitourinary:    Comments: Deferred. Musculoskeletal:     Cervical back: Neck supple.  Skin:    General: Skin is warm and dry.      Capillary Refill: Capillary refill takes less than 2 seconds.     Findings: No rash.  Neurological:     General: No focal deficit present.     Mental Status: She is alert and oriented to person, place, and time. Mental status is at baseline.     Cranial Nerves: No dysarthria or facial asymmetry.  Psychiatric:        Mood and Affect: Mood normal.        Speech: Speech normal.        Behavior: Behavior normal.        Thought Content: Thought content normal.        Judgment: Judgment normal.      UC Treatments / Results  Labs (all labs ordered are listed, but only abnormal results are displayed) Labs Reviewed  HIV ANTIBODY (ROUTINE TESTING W REFLEX)  RPR  CERVICOVAGINAL ANCILLARY ONLY    EKG   Radiology No results found.  Procedures Procedures (including critical care time)  Medications Ordered in UC Medications - No data to display  Initial Impression / Assessment and Plan / UC Course  I have reviewed the triage vital signs and the nursing notes.  Pertinent labs & imaging results that were available during my care of the patient were reviewed by me and considered in my medical decision making (see chart for details).   1.  At risk for transmitted infection due to unprotected sex, vaginal discharge STI labs pending.  Patient agrees to HIV and syphilis testing today.  Will notify patient of positive results and treat accordingly when labs come back.  Patient to avoid sexual intercourse until screening testing comes back.  Education provided regarding safe sexual practices and patient encouraged to use protection to prevent spread of STIs.   Counseled patient on potential for adverse effects with medications prescribed/recommended today, strict ER and return-to-clinic precautions discussed, patient verbalized understanding.    Final Clinical Impressions(s) / UC Diagnoses   Final diagnoses:  At risk for sexually transmitted infection due to unprotected sex  Vaginal  discharge     Discharge  Instructions      Your STD testing has been sent to the lab and will come back in the next 2 to 3 days.  We will call you if any of your results are positive requiring treatment and treat you at that time.   If you do not receive a phone call from [place], this means your testing was negative.  Avoid sexual intercourse until your STD results come back.  If any of your STD results are positive, you will need to avoid sexual intercourse for 7 days while you are being treated to prevent spread of STD.  Condom use is the best way to prevent spread of STDs.  Return to urgent care as needed.      ED Prescriptions   None    PDMP not reviewed this encounter.   Carlisle Beers, [place] 09/06/22 1701

## 2022-09-06 NOTE — Discharge Instructions (Signed)
Your STD testing has been sent to the lab and will come back in the next 2 to 3 days.  We will call you if any of your results are positive requiring treatment and treat you at that time.   If you do not receive a phone call from us, this means your testing was negative.  Avoid sexual intercourse until your STD results come back.  If any of your STD results are positive, you will need to avoid sexual intercourse for 7 days while you are being treated to prevent spread of STD.  Condom use is the best way to prevent spread of STDs.  Return to urgent care as needed.  

## 2022-09-08 ENCOUNTER — Telehealth (HOSPITAL_COMMUNITY): Payer: Self-pay | Admitting: Emergency Medicine

## 2022-09-08 MED ORDER — METRONIDAZOLE 500 MG PO TABS: 500.0000 mg | ORAL_TABLET | Freq: Two times a day (BID) | ORAL | 0 refills | Status: DC

## 2022-12-15 DIAGNOSIS — J45909 Unspecified asthma, uncomplicated: Secondary | ICD-10-CM | POA: Insufficient documentation

## 2023-02-08 ENCOUNTER — Ambulatory Visit
Admission: EM | Admit: 2023-02-08 | Discharge: 2023-02-08 | Disposition: A | Discharge status: Home / Self Care | Payer: BC Managed Care – PPO | Attending: Family Medicine | Admitting: Family Medicine

## 2023-02-08 DIAGNOSIS — R300 Dysuria: Secondary | ICD-10-CM | POA: Insufficient documentation

## 2023-02-08 DIAGNOSIS — H01132 Eczematous dermatitis of right lower eyelid: Secondary | ICD-10-CM | POA: Insufficient documentation

## 2023-02-08 DIAGNOSIS — H01131 Eczematous dermatitis of right upper eyelid: Secondary | ICD-10-CM | POA: Insufficient documentation

## 2023-02-08 DIAGNOSIS — H01134 Eczematous dermatitis of left upper eyelid: Secondary | ICD-10-CM | POA: Insufficient documentation

## 2023-02-08 DIAGNOSIS — L209 Atopic dermatitis, unspecified: Secondary | ICD-10-CM | POA: Diagnosis not present

## 2023-02-08 DIAGNOSIS — N3000 Acute cystitis without hematuria: Secondary | ICD-10-CM | POA: Insufficient documentation

## 2023-02-08 DIAGNOSIS — H01135 Eczematous dermatitis of left lower eyelid: Secondary | ICD-10-CM | POA: Diagnosis present

## 2023-02-08 LAB — POCT URINALYSIS DIP (MANUAL ENTRY)
Bilirubin, UA: NEGATIVE
Blood, UA: NEGATIVE
Glucose, UA: NEGATIVE mg/dL
Ketones, POC UA: NEGATIVE mg/dL
Nitrite, UA: POSITIVE — AB
Protein Ur, POC: 30 mg/dL — AB
Spec Grav, UA: 1.025 (ref 1.010–1.025)
Urobilinogen, UA: 1 U/dL
pH, UA: 6.5 (ref 5.0–8.0)

## 2023-02-08 LAB — POCT URINE PREGNANCY: Preg Test, Ur: NEGATIVE

## 2023-02-08 MED ORDER — CEPHALEXIN 500 MG PO CAPS: 500.0000 mg | ORAL_CAPSULE | Freq: Two times a day (BID) | ORAL | 0 refills | Status: DC

## 2023-02-08 MED ORDER — PREDNISONE 20 MG PO TABS: ORAL_TABLET | ORAL | 0 refills | Status: DC

## 2023-02-08 MED ORDER — FLUCONAZOLE 150 MG PO TABS: 150.0000 mg | ORAL_TABLET | ORAL | 0 refills | Status: DC

## 2023-02-08 MED ORDER — PIMECROLIMUS 1 % EX CREA: TOPICAL_CREAM | CUTANEOUS | 0 refills | Status: DC

## 2023-02-08 NOTE — ED Triage Notes (Signed)
 Pt c/o rash to face x 2 weeks-states she was seen by dermatologist in the past for same with dx-states she is out of rx cream-pt c/o pressure when she voids with foul smelling urine x 2 weeks-was treated for UTI ~1 month with noncompliant abx-NAD-steady gait

## 2023-02-08 NOTE — ED Provider Notes (Signed)
 [facility]  Note:  This document was prepared using Conservation officer, historic buildings and may include unintentional dictation errors.  MRN: [id number] DOB: 1995-05-17  Subjective:   Taygen Newsome is a 27 y.o. female presenting for 2 complaints.  Has a recurrent rash over her face and eyelids.  Has previously been told she has seborrheic dermatitis.  She follows with dermatologist.  Julius to avoid irritating skin products.  Uses very bland hygiene products for gentle skin. Reports dysuria, urinary pressure, malodorous urine for 2 weeks.  Has been treated for UTI before, last episode was a month ago.   No current facility-administered medications for this encounter.  Current Outpatient Medications:    acetaminophen  (TYLENOL ) 325 MG tablet, Take 650 mg by mouth every 3 (three) hours as needed (for hand-related pain). , Disp: , Rfl:    albuterol  (VENTOLIN  HFA) 108 (90 Base) MCG/ACT inhaler, Inhale 2 puffs into the lungs every 6 (six) hours as needed for wheezing or shortness of breath., Disp: 8 g, Rfl: 2   metroNIDAZOLE  (FLAGYL ) 500 MG tablet, Take 1 tablet (500 mg total) by mouth 2 (two) times daily., Disp: 14 tablet, Rfl: 0   naproxen  (NAPROSYN ) 375 MG tablet, Take 1 tablet (375 mg total) by mouth 2 (two) times daily with a meal., Disp: 30 tablet, Rfl: 0   No Known Allergies  Past Medical History:  Diagnosis Date   Asthma      Past Surgical History:  Procedure Laterality Date   TONSILLECTOMY      No family history on file.  Social History   Tobacco Use   Smoking status: Never   Smokeless tobacco: Never  Vaping Use   Vaping status: Never Used  Substance Use Topics   Alcohol use: Not Currently   Drug use: Never    ROS   Objective:   Vitals: BP 110/75 (BP Location: Right Arm)   Pulse 66   Temp 98.8 F (37.1 C) (Oral)   Resp 16   LMP 01/12/2023   SpO2 100%   Physical Exam Constitutional:      General: She is not in acute  distress.    Appearance: Normal appearance. She is well-developed. She is not ill-appearing, toxic-appearing or diaphoretic.  HENT:     Head: Normocephalic and atraumatic.      Nose: Nose normal.     Mouth/Throat:     Mouth: Mucous membranes are moist.     Pharynx: Oropharynx is clear.  Eyes:     General: No scleral icterus.       Right eye: No discharge.        Left eye: No discharge.     Extraocular Movements: Extraocular movements intact.     Conjunctiva/sclera: Conjunctivae normal.  Cardiovascular:     Rate and Rhythm: Normal rate.  Pulmonary:     Effort: Pulmonary effort is normal.  Abdominal:     General: Bowel sounds are normal. There is no distension.     Palpations: Abdomen is soft. There is no mass.     Tenderness: There is no abdominal tenderness. There is no right CVA tenderness, left CVA tenderness, guarding or rebound.  Skin:    General: Skin is warm and dry.  Neurological:     General: No focal deficit present.     Mental Status: She is alert and oriented to person, place, and time.  Psychiatric:        Mood and Affect: Mood normal.  Behavior: Behavior normal.        Thought Content: Thought content normal.        Judgment: Judgment normal.     Results for orders placed or performed during the hospital encounter of 02/08/23 (from the past 24 hours)  POCT urinalysis dipstick     Status: Abnormal   Collection Time: 02/08/23 11:10 AM  Result Value Ref Range   Color, UA yellow yellow   Clarity, UA cloudy (A) clear   Glucose, UA negative negative mg/dL   Bilirubin, UA negative negative   Ketones, POC UA negative negative mg/dL   Spec Grav, UA 8.974 8.989 - 1.025   Blood, UA negative negative   pH, UA 6.5 5.0 - 8.0   Protein Ur, POC =30 (A) negative mg/dL   Urobilinogen, UA 1.0 0.2 or 1.0 E.U./dL   Nitrite, UA Positive (A) Negative   Leukocytes, UA Trace (A) Negative  POCT urine pregnancy     Status: None   Collection Time: 02/08/23 11:10 AM   Result Value Ref Range   Preg Test, Ur Negative Negative    Assessment and Plan :   PDMP not reviewed this encounter.  1. Atopic dermatitis of face   2. Dysuria   3. Eczematous dermatitis of upper and lower eyelids of both eyes   4. Acute cystitis without hematuria      Recommended a oral prednisone  course for atopic dermatitis, eczematous dermatitis.  Can also use pimecrolimus  for the face.  Follow-up with her dermatologist. Start cephalexin  to cover for acute cystitis, urine culture pending.  Recommended aggressive hydration, limiting urinary irritants.  Use fluconazole  for antibiotic and steroid associated yeast infection.  Counseled patient on potential for adverse effects with medications prescribed/recommended today, ER and return-to-clinic precautions discussed, patient verbalized understanding.    Christopher Savannah, [place] 02/08/23 8095

## 2023-02-08 NOTE — Discharge Instructions (Signed)
 Please start a 10-day course of prednisone  to help you with your facial rash, atopic dermatitis and eczematous dermatitis of your eyelids.  You can start using pimecrolimus  cream for the face to help with this as well.  Follow-up with your dermatologist soon as possible.  Please start cephalexin  to address an urinary tract infection. Make sure you hydrate very well with plain water and a quantity of 80 ounces of water a day.  Please limit drinks that are considered urinary irritants such as soda, sweet tea, coffee, energy drinks, alcohol.  These can worsen your urinary and genital symptoms but also be the source of them.  I will let you know about your urine culture results through MyChart to see if we need to prescribe or change your antibiotics based off of those results.  Because we are using both the steroid and an antibiotic, I highly recommend taking an oral antifungal to prevent a yeast infection that can develop from using these medications.  Please take fluconazole  for this.

## 2023-02-11 LAB — URINE CULTURE: Culture: >=100000 — AB

## 2023-06-13 ENCOUNTER — Ambulatory Visit
Admission: RE | Admit: 2023-06-13 | Discharge: 2023-06-13 | Discharge status: Against Medical Advice | Source: Ambulatory Visit

## 2023-06-13 DIAGNOSIS — Z5320 Procedure and treatment not carried out because of patient's decision for unspecified reasons: Secondary | ICD-10-CM

## 2023-06-13 NOTE — ED Provider Notes (Signed)
 Did not respond to triage, patient had left the building   Buena Carmine, NP 06/13/23 1806

## 2023-06-13 NOTE — ED Notes (Signed)
 Called patient from the lobby. No response.

## 2023-07-05 ENCOUNTER — Other Ambulatory Visit: Payer: Self-pay

## 2023-07-05 ENCOUNTER — Ambulatory Visit
Admission: EM | Admit: 2023-07-05 | Discharge: 2023-07-05 | Disposition: A | Discharge status: Home / Self Care | Attending: Nurse Practitioner | Admitting: Nurse Practitioner

## 2023-07-05 DIAGNOSIS — N3001 Acute cystitis with hematuria: Secondary | ICD-10-CM | POA: Diagnosis present

## 2023-07-05 DIAGNOSIS — Z113 Encounter for screening for infections with a predominantly sexual mode of transmission: Secondary | ICD-10-CM | POA: Diagnosis not present

## 2023-07-05 DIAGNOSIS — E860 Dehydration: Secondary | ICD-10-CM | POA: Diagnosis not present

## 2023-07-05 DIAGNOSIS — R350 Frequency of micturition: Secondary | ICD-10-CM

## 2023-07-05 DIAGNOSIS — N898 Other specified noninflammatory disorders of vagina: Secondary | ICD-10-CM | POA: Diagnosis present

## 2023-07-05 LAB — POCT URINE PREGNANCY: Preg Test, Ur: NEGATIVE

## 2023-07-05 LAB — POCT URINALYSIS DIP (MANUAL ENTRY)
Bilirubin, UA: NEGATIVE
Glucose, UA: NEGATIVE mg/dL
Nitrite, UA: NEGATIVE
Protein Ur, POC: 100 mg/dL — AB
Spec Grav, UA: >=1.03 — AB (ref 1.010–1.025)
Urobilinogen, UA: 0.2 U/dL
pH, UA: 6 (ref 5.0–8.0)

## 2023-07-05 MED ORDER — NITROFURANTOIN MONOHYD MACRO 100 MG PO CAPS: 100.0000 mg | ORAL_CAPSULE | Freq: Two times a day (BID) | ORAL | 0 refills | Status: DC

## 2023-07-05 MED ORDER — FLUCONAZOLE 150 MG PO TABS: 150.0000 mg | ORAL_TABLET | ORAL | 0 refills | Status: AC

## 2023-07-05 NOTE — ED Provider Notes (Signed)
 [facility]    CSN: [id number] Arrival date & time: 07/05/23  1527      History   Chief Complaint Chief Complaint  Patient presents with   Dysuria    HPI Sheri Zimmerman is a 28 y.o. female.   Sheri Zimmerman is a 28 y.o. female that presents with symptoms suggestive of a urinary tract infection. She reports experiencing itching, frequent urination, and urgency. The patient denies any pain or burning during urination, as well as any visible blood in her urine. She does, however, mention having lower back pain. The patient's last menstrual period was 06/03/23. She is sexually active and uses birth control. The patient reports having two female sexual partners in the past three months, using condoms with only one of them.  The patient denies experiencing nausea or vomiting. No vaginal discharge.  The following portions of the patient's history were reviewed and updated as appropriate: allergies, current medications, past family history, past medical history, past social history, past surgical history, and problem list.    Past Medical History:  Diagnosis Date   Asthma     There are no active problems to display for this patient.   Past Surgical History:  Procedure Laterality Date   TONSILLECTOMY      OB History     Gravida  0   Para  0   Term  0   Preterm  0   AB  0   Living  0      SAB  0   IAB  0   Ectopic  0   Multiple  0   Live Births  0            Home Medications    Prior to Admission medications   Medication Sig Start Date End Date Taking? Authorizing Provider  fluconazole  (DIFLUCAN ) 150 MG tablet Take 1 tablet (150 mg total) by mouth every 3 (three) days for 2 doses. 07/05/23 07/09/23 Yes Esha Fincher, FNP  nitrofurantoin, macrocrystal-monohydrate, (MACROBID) 100 MG capsule Take 1 capsule (100 mg total) by mouth 2 (two) times daily. 07/05/23  Yes Maryruth Sol, FNP  acetaminophen  (TYLENOL ) 325 MG tablet Take 650 mg by  mouth every 3 (three) hours as needed (for hand-related pain).     [provider]  albuterol  (VENTOLIN  HFA) 108 (90 Base) MCG/ACT inhaler Inhale 2 puffs into the lungs every 6 (six) hours as needed for wheezing or shortness of breath. 07/16/21   Sandi Crosby, PA-C    Family History History reviewed. No pertinent family history.  Social History Social History   Tobacco Use   Smoking status: Never   Smokeless tobacco: Never  Vaping Use   Vaping status: Never Used  Substance Use Topics   Alcohol use: Not Currently   Drug use: Never     Allergies   Patient has no known allergies.   Review of Systems Review of Systems  Constitutional:  Negative for fever.  Gastrointestinal:  Negative for abdominal pain, nausea and vomiting.  Genitourinary:  Positive for frequency and urgency. Negative for dysuria, hematuria, menstrual problem (LMP 06/04/23) and vaginal discharge.  Musculoskeletal:  Positive for back pain (lower).  All other systems reviewed and are negative.    Physical Exam Triage Vital Signs ED Triage Vitals  Encounter Vitals Group     BP 07/05/23 1559 114/73     Systolic BP Percentile --      Diastolic BP Percentile --      Pulse Rate 07/05/23  1559 71     Resp 07/05/23 1559 16     Temp 07/05/23 1559 98.2 F (36.8 C)     Temp Source 07/05/23 1559 Oral     SpO2 07/05/23 1559 97 %     Weight --      Height --      Head Circumference --      Peak Flow --      Pain Score 07/05/23 1557 6     Pain Loc --      Pain Education --      Exclude from Growth Chart --    No data found.  Updated Vital Signs BP 114/73   Pulse 71   Temp 98.2 F (36.8 C) (Oral)   Resp 16   LMP 06/04/2023   SpO2 97%   Visual Acuity Right Eye Distance:   Left Eye Distance:   Bilateral Distance:    Right Eye Near:   Left Eye Near:    Bilateral Near:     Physical Exam Constitutional:      General: She is not in acute distress.    Appearance: Normal appearance. She is  not ill-appearing, toxic-appearing or diaphoretic.  HENT:     Head: Normocephalic.     Nose: Nose normal.     Mouth/Throat:     Mouth: Mucous membranes are moist.  Eyes:     Conjunctiva/sclera: Conjunctivae normal.  Cardiovascular:     Rate and Rhythm: Normal rate.  Pulmonary:     Effort: Pulmonary effort is normal.  Abdominal:     Palpations: Abdomen is soft.  Genitourinary:    Comments: Deferred; patient performed self-swab for Aptima testing  Musculoskeletal:        General: Normal range of motion.     Cervical back: Normal range of motion and neck supple.  Skin:    General: Skin is warm and dry.  Neurological:     General: No focal deficit present.     Mental Status: She is alert and oriented to person, place, and time.  Psychiatric:        Mood and Affect: Mood normal.        Behavior: Behavior normal.      UC Treatments / Results  Labs (all labs ordered are listed, but only abnormal results are displayed) Labs Reviewed  POCT URINALYSIS DIP (MANUAL ENTRY) - Abnormal; Notable for the following components:      Result Value   Clarity, UA cloudy (*)    Ketones, POC UA trace (5) (*)    Spec Grav, UA >=1.030 (*)    Blood, UA moderate (*)    Protein Ur, POC =100 (*)    Leukocytes, UA Large (3+) (*)    All other components within normal limits  URINE CULTURE  RPR  HIV ANTIBODY (ROUTINE TESTING W REFLEX)  HEPATITIS PANEL, ACUTE  POCT URINE PREGNANCY  CERVICOVAGINAL ANCILLARY ONLY    EKG   Radiology No results found.  Procedures Procedures (including critical care time)  Medications Ordered in UC Medications - No data to display  Initial Impression / Assessment and Plan / UC Course  I have reviewed the triage vital signs and the nursing notes.  Pertinent labs & imaging results that were available during my care of the patient were reviewed by me and considered in my medical decision making (see chart for details).     Patient presents with symptoms  consistent with a urinary tract infection, including urinary frequency, urgency, and lower  back pain. Urinalysis reveals leukocytes indicating infection, microscopic blood possibly due to irritation or menstruation, and ketones suggestive of mild dehydration. Patient denies dysuria or visible blood in urine. History of recent sexual activity with two female partners and inconsistent condom use increases risk for both UTIs and sexually transmitted infections. Nitrofurantoin 100 mg twice daily for five days was prescribed. A urine culture was obtained to confirm the causative organism and guide antibiotic sensitivity. Patient was advised to increase fluid intake and avoid sexual activity until results return and symptoms resolve. Urine culture results will be reviewed and patient will be contacted only if a change in treatment is needed. Results will also be available on MyChart.  Patient also reports vaginal itching without discharge. Given the symptoms and recent sexual history, further evaluation for yeast and other vaginal infections was performed. Fluconazole  was prescribed with a second dose to be taken in three days. A vaginal swab was collected for STI and non-STI testing, and a pregnancy test was performed. Blood was drawn for STD screening, including HIV, syphilis, and hepatitis, pending.  Today's evaluation has revealed no signs of a dangerous process. Discussed diagnosis with patient and/or guardian. Patient and/or guardian aware of their diagnosis, possible red flag symptoms to watch out for and need for close follow up. Patient and/or guardian understands verbal and written discharge instructions. Patient and/or guardian comfortable with plan and disposition.  Patient and/or guardian has a clear mental status at this time, good insight into illness (after discussion and teaching) and has clear judgment to make decisions regarding their care  Documentation was completed with the aid of voice  recognition software. Transcription may contain typographical errors.  Final Clinical Impressions(s) / UC Diagnoses   Final diagnoses:  Urinary frequency  Acute cystitis with hematuria  Screening examination for STD (sexually transmitted disease)  Mild dehydration  Vaginal itching     Discharge Instructions      You were seen today for symptoms that suggest a urinary tract infection (UTI), including frequent urination, urgency, and lower back discomfort. Your urine test showed signs of infection, along with a small amount of blood that may be from irritation and some ketones that may indicate mild dehydration. While you did not report burning with urination or visible blood in your urine, your symptoms and test results support a diagnosis of a UTI.  You have been prescribed an antibiotic called nitrofurantoin (Macrobid), which you should take twice a day for the next five days. Be sure to take all of the medication, even if you start feeling better before you finish it. Drink plenty of fluids throughout the day to help flush your urinary system and support recovery. Please avoid sexual activity until your symptoms have resolved and your treatment is complete.  We have sent your urine for culture to identify the specific bacteria causing the infection and to make sure the prescribed antibiotic is appropriate. You will be contacted if the results show that a different treatment is needed. Otherwise, you can view the results through your MyChart account.  You also mentioned vaginal itching without any discharge. Because of your recent sexual activity and the nature of your symptoms, we checked for vaginal and sexually transmitted infections. You were prescribed fluconazole  (Diflucan ) to treat a possible yeast infection. Take one dose now and a second dose in three days. A vaginal swab was collected to check for infections. Blood was drawn to screen for sexually transmitted infections, including  HIV, syphilis, and hepatitis.   If  your symptoms worsen, if you develop a fever, nausea, vomiting, or new pain, or if you have any concerns about your medications or test results, please contact your healthcare provider.    ED Prescriptions     Medication Sig Dispense Auth. Provider   nitrofurantoin, macrocrystal-monohydrate, (MACROBID) 100 MG capsule Take 1 capsule (100 mg total) by mouth 2 (two) times daily. 10 capsule Maryruth Sol, FNP   fluconazole  (DIFLUCAN ) 150 MG tablet Take 1 tablet (150 mg total) by mouth every 3 (three) days for 2 doses. 2 tablet Maryruth Sol, FNP      PDMP not reviewed this encounter.   Maryruth Sol, [place] 07/05/23 1700

## 2023-07-05 NOTE — Discharge Instructions (Addendum)
 You were seen today for symptoms that suggest a urinary tract infection (UTI), including frequent urination, urgency, and lower back discomfort. Your urine test showed signs of infection, along with a small amount of blood that may be from irritation and some ketones that may indicate mild dehydration. While you did not report burning with urination or visible blood in your urine, your symptoms and test results support a diagnosis of a UTI.  You have been prescribed an antibiotic called nitrofurantoin (Macrobid), which you should take twice a day for the next five days. Be sure to take all of the medication, even if you start feeling better before you finish it. Drink plenty of fluids throughout the day to help flush your urinary system and support recovery. Please avoid sexual activity until your symptoms have resolved and your treatment is complete.  We have sent your urine for culture to identify the specific bacteria causing the infection and to make sure the prescribed antibiotic is appropriate. You will be contacted if the results show that a different treatment is needed. Otherwise, you can view the results through your MyChart account.  You also mentioned vaginal itching without any discharge. Because of your recent sexual activity and the nature of your symptoms, we checked for vaginal and sexually transmitted infections. You were prescribed fluconazole  (Diflucan ) to treat a possible yeast infection. Take one dose now and a second dose in three days. A vaginal swab was collected to check for infections. Blood was drawn to screen for sexually transmitted infections, including HIV, syphilis, and hepatitis.   If your symptoms worsen, if you develop a fever, nausea, vomiting, or new pain, or if you have any concerns about your medications or test results, please contact your healthcare provider.

## 2023-07-05 NOTE — ED Triage Notes (Signed)
 Pt c/o dysuria, vaginal itching, RLQ abdominal painx2-3d. Pt wants STI testing

## 2023-07-06 LAB — CERVICOVAGINAL ANCILLARY ONLY
Bacterial Vaginitis (gardnerella): NEGATIVE
Candida Glabrata: NEGATIVE
Candida Vaginitis: POSITIVE — AB
Chlamydia: POSITIVE — AB
Comment: NEGATIVE
Comment: NEGATIVE
Comment: NEGATIVE
Comment: NEGATIVE
Comment: NEGATIVE
Comment: NORMAL
Neisseria Gonorrhea: NEGATIVE
Trichomonas: NEGATIVE

## 2023-07-06 LAB — URINE CULTURE: Culture: >=100000 — AB

## 2023-07-06 LAB — RPR: RPR Ser Ql: NONREACTIVE

## 2023-07-06 LAB — HIV ANTIBODY (ROUTINE TESTING W REFLEX): HIV Screen 4th Generation wRfx: NONREACTIVE

## 2023-07-07 ENCOUNTER — Ambulatory Visit (HOSPITAL_COMMUNITY): Payer: Self-pay

## 2023-07-07 MED ORDER — DOXYCYCLINE HYCLATE 100 MG PO TABS: 100.0000 mg | ORAL_TABLET | Freq: Two times a day (BID) | ORAL | 0 refills | Status: AC

## 2023-09-09 ENCOUNTER — Ambulatory Visit
Admission: RE | Admit: 2023-09-09 | Discharge: 2023-09-09 | Disposition: A | Discharge status: Home / Self Care | Source: Ambulatory Visit | Attending: Family Medicine | Admitting: Family Medicine

## 2023-09-09 VITALS — BP 120/83 | HR 85 | Temp 98.6°F | Resp 16

## 2023-09-09 DIAGNOSIS — N3001 Acute cystitis with hematuria: Secondary | ICD-10-CM

## 2023-09-09 DIAGNOSIS — R300 Dysuria: Secondary | ICD-10-CM | POA: Diagnosis present

## 2023-09-09 DIAGNOSIS — Z113 Encounter for screening for infections with a predominantly sexual mode of transmission: Secondary | ICD-10-CM | POA: Diagnosis present

## 2023-09-09 LAB — POCT URINE DIPSTICK
Bilirubin, UA: NEGATIVE
Glucose, UA: NEGATIVE mg/dL
Nitrite, UA: POSITIVE — AB
POC PROTEIN,UA: NEGATIVE
Spec Grav, UA: 1.025 (ref 1.010–1.025)
Urobilinogen, UA: 0.2 U/dL
pH, UA: 6 (ref 5.0–8.0)

## 2023-09-09 LAB — POCT URINE PREGNANCY

## 2023-09-09 MED ORDER — CEPHALEXIN 500 MG PO CAPS: 500.0000 mg | ORAL_CAPSULE | Freq: Two times a day (BID) | ORAL | 0 refills | Status: DC

## 2023-09-09 NOTE — Discharge Instructions (Signed)
 Please start cephalexin  (Keflex ) to address an urinary tract infection. Make sure you hydrate very well with plain water and a quantity of 64 ounces of water a day.  Please limit drinks that are considered urinary irritants such as soda, sweet tea, coffee, energy drinks, alcohol.  These can worsen your urinary and genital symptoms but also be the source of them.  I will let you know about your urine culture, vaginal swab and blood draw results through MyChart to see if we need to prescribe or change your antibiotics based off of those results.

## 2023-09-09 NOTE — ED Provider Notes (Signed)
 [facility]  Note:  This document was prepared using Conservation officer, historic buildings and may include unintentional dictation errors.  MRN: [id number] DOB: April 28, 1995  Subjective:   Sheri Zimmerman is a 28 y.o. female presenting for 3 day history of dysuria, urinary urgency. Denies fever, n/v, abdominal pain, pelvic pain, rashes, hematuria, vaginal discharge.  Would like to have STI check including blood work. Was seen by a dermatologist and diagnosed with pityriasis rosea so this prompted her to want to be checked for STIs. No known STI exposure. Has not hydrated well, drinks soda, fruit juices daily.    No current facility-administered medications for this encounter.  Current Outpatient Medications:    acetaminophen  (TYLENOL ) 325 MG tablet, Take 650 mg by mouth every 3 (three) hours as needed (for hand-related pain). , Disp: , Rfl:    albuterol  (VENTOLIN  HFA) 108 (90 Base) MCG/ACT inhaler, Inhale 2 puffs into the lungs every 6 (six) hours as needed for wheezing or shortness of breath., Disp: 8 g, Rfl: 2   nitrofurantoin , macrocrystal-monohydrate, (MACROBID ) 100 MG capsule, Take 1 capsule (100 mg total) by mouth 2 (two) times daily., Disp: 10 capsule, Rfl: 0   No Known Allergies  Past Medical History:  Diagnosis Date   Asthma      Past Surgical History:  Procedure Laterality Date   TONSILLECTOMY      History reviewed. No pertinent family history.  Social History   Tobacco Use   Smoking status: Never   Smokeless tobacco: Never  Vaping Use   Vaping status: Never Used  Substance Use Topics   Alcohol use: Not Currently   Drug use: Never    ROS   Objective:   Vitals: BP 120/83 (BP Location: Right Arm)   Pulse 85   Temp 98.6 F (37 C) (Oral)   Resp 16   LMP 07/21/2023 (Exact Date) Comment: 2 weeks  SpO2 98%   Physical Exam Constitutional:      General: She is not in acute distress.    Appearance: Normal appearance. She is  well-developed. She is not ill-appearing, toxic-appearing or diaphoretic.  HENT:     Head: Normocephalic and atraumatic.     Nose: Nose normal.     Mouth/Throat:     Mouth: Mucous membranes are moist.  Eyes:     General: No scleral icterus.       Right eye: No discharge.        Left eye: No discharge.     Extraocular Movements: Extraocular movements intact.     Conjunctiva/sclera: Conjunctivae normal.  Cardiovascular:     Rate and Rhythm: Normal rate.  Pulmonary:     Effort: Pulmonary effort is normal.  Abdominal:     General: Bowel sounds are normal. There is no distension.     Palpations: Abdomen is soft. There is no mass.     Tenderness: There is no abdominal tenderness. There is no right CVA tenderness, left CVA tenderness, guarding or rebound.  Skin:    General: Skin is warm and dry.  Neurological:     General: No focal deficit present.     Mental Status: She is alert and oriented to person, place, and time.  Psychiatric:        Mood and Affect: Mood normal.        Behavior: Behavior normal.        Thought Content: Thought content normal.        Judgment: Judgment normal.    Results  for orders placed or performed during the hospital encounter of 09/09/23 (from the past 24 hours)  POCT URINE DIPSTICK     Status: Abnormal   Collection Time: 09/09/23  5:19 PM  Result Value Ref Range   Color, UA yellow yellow   Clarity, UA hazy (A) clear   Glucose, UA negative negative mg/dL   Bilirubin, UA negative negative   Ketones, POC UA trace (5) (A) negative mg/dL   Spec Grav, UA 8.974 8.989 - 1.025   Blood, UA large (A) negative   pH, UA 6.0 5.0 - 8.0   POC PROTEIN,UA negative negative, trace   Urobilinogen, UA 0.2 0.2 or 1.0 E.U./dL   Nitrite, UA Positive (A) Negative   Leukocytes, UA Trace (A) Negative  POCT urine pregnancy     Status: Normal   Collection Time: 09/09/23  5:20 PM  Result Value Ref Range   Preg Test, Ur  Negative    Assessment and Plan :   PDMP not  reviewed this encounter.  1. Acute cystitis with hematuria   2. Dysuria   3. Screen for STD (sexually transmitted disease)    STI check pending. Start Keflex  to cover for acute cystitis, urine culture pending.  Recommended aggressive hydration, limiting urinary irritants. Counseled patient on potential for adverse effects with medications prescribed/recommended today, ER and return-to-clinic precautions discussed, patient verbalized understanding.    Christopher Savannah, [place] 09/09/23 1724

## 2023-09-09 NOTE — ED Triage Notes (Signed)
 Pt reports burning when urinating x 3 days.   Pt requested STD's.

## 2023-09-11 LAB — HIV ANTIBODY (ROUTINE TESTING W REFLEX): HIV Screen 4th Generation wRfx: NONREACTIVE

## 2023-09-11 LAB — RPR: RPR Ser Ql: NONREACTIVE

## 2023-09-12 ENCOUNTER — Ambulatory Visit (HOSPITAL_COMMUNITY): Payer: Self-pay

## 2023-09-12 LAB — CERVICOVAGINAL ANCILLARY ONLY
Bacterial Vaginitis (gardnerella): POSITIVE — AB
Candida Glabrata: NEGATIVE
Candida Vaginitis: POSITIVE — AB
Chlamydia: NEGATIVE
Comment: NEGATIVE
Comment: NEGATIVE
Comment: NEGATIVE
Comment: NEGATIVE
Comment: NEGATIVE
Comment: NORMAL
Neisseria Gonorrhea: NEGATIVE
Trichomonas: POSITIVE — AB

## 2023-09-12 MED ORDER — FLUCONAZOLE 150 MG PO TABS: 150.0000 mg | ORAL_TABLET | Freq: Once | ORAL | 0 refills | Status: AC

## 2023-09-12 MED ORDER — METRONIDAZOLE 500 MG PO TABS
500.0000 mg | ORAL_TABLET | Freq: Two times a day (BID) | ORAL | 0 refills | Status: AC
Start: 2023-09-12 — End: 2023-09-19

## 2023-12-01 ENCOUNTER — Ambulatory Visit

## 2023-12-02 ENCOUNTER — Ambulatory Visit
Admission: EM | Admit: 2023-12-02 | Discharge: 2023-12-02 | Disposition: A | Discharge status: Home / Self Care | Payer: PRIVATE HEALTH INSURANCE | Attending: Family Medicine | Admitting: Family Medicine

## 2023-12-02 DIAGNOSIS — N3001 Acute cystitis with hematuria: Secondary | ICD-10-CM | POA: Diagnosis present

## 2023-12-02 DIAGNOSIS — Z113 Encounter for screening for infections with a predominantly sexual mode of transmission: Secondary | ICD-10-CM | POA: Diagnosis present

## 2023-12-02 DIAGNOSIS — Z202 Contact with and (suspected) exposure to infections with a predominantly sexual mode of transmission: Secondary | ICD-10-CM | POA: Diagnosis present

## 2023-12-02 DIAGNOSIS — R300 Dysuria: Secondary | ICD-10-CM | POA: Insufficient documentation

## 2023-12-02 LAB — POCT URINE DIPSTICK
Bilirubin, UA: NEGATIVE
Glucose, UA: NEGATIVE mg/dL
Ketones, POC UA: NEGATIVE mg/dL
Nitrite, UA: NEGATIVE
POC PROTEIN,UA: NEGATIVE
Spec Grav, UA: 1.02 (ref 1.010–1.025)
Urobilinogen, UA: 0.2 U/dL
pH, UA: 7 (ref 5.0–8.0)

## 2023-12-02 LAB — POCT URINE PREGNANCY: Preg Test, Ur: NEGATIVE

## 2023-12-02 MED ORDER — FLUCONAZOLE 150 MG PO TABS: 150.0000 mg | ORAL_TABLET | ORAL | 0 refills | Status: DC

## 2023-12-02 MED ORDER — NITROFURANTOIN MONOHYD MACRO 100 MG PO CAPS: 100.0000 mg | ORAL_CAPSULE | Freq: Two times a day (BID) | ORAL | 0 refills | Status: DC

## 2023-12-02 NOTE — ED Provider Notes (Signed)
 [facility]  Note:  This document was prepared using Conservation officer, historic buildings and may include unintentional dictation errors.  MRN: [id number] DOB: 09-Apr-1995  Subjective:   Sheri Zimmerman is a 28 y.o. female presenting for 2-3 days of dysuria, malodorous urine.  She is concerned about having an exposure to a sexually transmitted infection.  She received a call and notification that she needs to be tested.  This was not from the health department.  She has only had 1 partner and reach out to them but they were very confused as to what the patient was referring to. Denies fever, n/v, abdominal pain, pelvic pain, rashes, urinary frequency, hematuria, vaginal discharge.  Hydrates very well.  She does a lot of coffee as she has an active and busy lifestyle.  No current facility-administered medications for this encounter.  Current Outpatient Medications:    acetaminophen  (TYLENOL ) 325 MG tablet, Take 650 mg by mouth every 3 (three) hours as needed (for hand-related pain). , Disp: , Rfl:    albuterol  (VENTOLIN  HFA) 108 (90 Base) MCG/ACT inhaler, Inhale 2 puffs into the lungs every 6 (six) hours as needed for wheezing or shortness of breath., Disp: 8 g, Rfl: 2   cephALEXin  (KEFLEX ) 500 MG capsule, Take 1 capsule (500 mg total) by mouth 2 (two) times daily., Disp: 10 capsule, Rfl: 0   nitrofurantoin , macrocrystal-monohydrate, (MACROBID ) 100 MG capsule, Take 1 capsule (100 mg total) by mouth 2 (two) times daily., Disp: 10 capsule, Rfl: 0   No Known Allergies  Past Medical History:  Diagnosis Date   Asthma      Past Surgical History:  Procedure Laterality Date   TONSILLECTOMY      No family history on file.  Social History   Tobacco Use   Smoking status: Never   Smokeless tobacco: Never  Vaping Use   Vaping status: Never Used  Substance Use Topics   Alcohol use: Not Currently   Drug use: Never    ROS   Objective:   Vitals: BP 115/74 (BP  Location: Left Arm)   Pulse 69   Temp 98.3 F (36.8 C) (Oral)   Resp 16   LMP 11/12/2023   SpO2 98%   Physical Exam Constitutional:      General: She is not in acute distress.    Appearance: Normal appearance. She is well-developed. She is not ill-appearing, toxic-appearing or diaphoretic.  HENT:     Head: Normocephalic and atraumatic.     Right Ear: External ear normal.     Left Ear: External ear normal.     Nose: Nose normal.     Mouth/Throat:     Mouth: Mucous membranes are moist.  Eyes:     General: No scleral icterus.       Right eye: No discharge.        Left eye: No discharge.     Extraocular Movements: Extraocular movements intact.     Conjunctiva/sclera: Conjunctivae normal.  Cardiovascular:     Rate and Rhythm: Normal rate.  Pulmonary:     Effort: Pulmonary effort is normal.  Abdominal:     General: Bowel sounds are normal. There is no distension.     Palpations: Abdomen is soft. There is no mass.     Tenderness: There is no abdominal tenderness. There is no right CVA tenderness, left CVA tenderness, guarding or rebound.  Skin:    General: Skin is warm and dry.  Neurological:     General:  No focal deficit present.     Mental Status: She is alert and oriented to person, place, and time.  Psychiatric:        Mood and Affect: Mood normal.        Behavior: Behavior normal.        Thought Content: Thought content normal.        Judgment: Judgment normal.     Results for orders placed or performed during the hospital encounter of 12/02/23 (from the past 24 hours)  POCT URINE DIPSTICK     Status: Abnormal   Collection Time: 12/02/23 10:44 AM  Result Value Ref Range   Color, UA yellow yellow   Clarity, UA hazy (A) clear   Glucose, UA negative negative mg/dL   Bilirubin, UA negative negative   Ketones, POC UA negative negative mg/dL   Spec Grav, UA 8.979 8.989 - 1.025   Blood, UA small (A) negative   pH, UA 7.0 5.0 - 8.0   POC PROTEIN,UA negative negative,  trace   Urobilinogen, UA 0.2 0.2 or 1.0 E.U./dL   Nitrite, UA Negative Negative   Leukocytes, UA Small (1+) (A) Negative  POCT urine pregnancy     Status: None   Collection Time: 12/02/23 10:44 AM  Result Value Ref Range   Preg Test, Ur Negative Negative    Assessment and Plan :   PDMP not reviewed this encounter.  1. Acute cystitis with hematuria   2. Dysuria   3. Screen for STD (sexually transmitted disease)   4. Possible exposure to STI     Given possible exposure to STI, will check completely.  This is a possible questionable source and therefore patient and I agreed to defer empiric treatment.  Instead we will treat for acute cystitis with Macrobid .  Fluconazole  for antibiotic associated yeast infection.  Hydrate very well and consistently as she is.  Urine culture pending.  Counseled patient on potential for adverse effects with medications prescribed/recommended today, ER and return-to-clinic precautions discussed, patient verbalized understanding.    Christopher Savannah, PA-C 12/02/23 1104

## 2023-12-02 NOTE — Discharge Instructions (Addendum)
 Please start Macrobid  to address an urinary tract infection. Use fluconazole  for yeast infection. Make sure you hydrate very well with plain water and a quantity of 80 ounces of water a day.  Please limit drinks that are considered urinary irritants such as fruit juices, soda, sweet tea, coffee, artifical sweetened drinks, energy drinks, alcohol.  These can worsen your urinary and genital symptoms but also be the source of them.  I will let you know about your STI and urine culture results through MyChart to see if we need to prescribe or change your antibiotics based off of those results.

## 2023-12-02 NOTE — ED Triage Notes (Signed)
 Pt requesting STD testing-also c/o dysuria and a foul smell-denies vaginal d/c-states she received text stating she had been exposed to a STD

## 2023-12-03 LAB — HIV ANTIBODY (ROUTINE TESTING W REFLEX): HIV Screen 4th Generation wRfx: NONREACTIVE

## 2023-12-03 LAB — RPR: RPR Ser Ql: NONREACTIVE

## 2023-12-04 LAB — URINE CULTURE: Culture: 20000 — AB

## 2023-12-05 ENCOUNTER — Ambulatory Visit (HOSPITAL_COMMUNITY): Payer: Self-pay

## 2023-12-05 LAB — CERVICOVAGINAL ANCILLARY ONLY
Bacterial Vaginitis (gardnerella): NEGATIVE
Candida Glabrata: NEGATIVE
Candida Vaginitis: NEGATIVE
Chlamydia: NEGATIVE
Comment: NEGATIVE
Comment: NEGATIVE
Comment: NEGATIVE
Comment: NEGATIVE
Comment: NEGATIVE
Comment: NORMAL
Neisseria Gonorrhea: NEGATIVE
Trichomonas: NEGATIVE

## 2023-12-07 LAB — MISC LABCORP TEST (SEND OUT): Labcorp test code: 180076

## 2024-02-18 ENCOUNTER — Other Ambulatory Visit: Payer: Self-pay

## 2024-02-18 ENCOUNTER — Emergency Department (HOSPITAL_COMMUNITY)
Admission: AD | Admit: 2024-02-18 | Discharge: 2024-02-19 | Disposition: A | Discharge status: Home / Self Care | Payer: Self-pay | Attending: Emergency Medicine | Admitting: Emergency Medicine

## 2024-02-18 ENCOUNTER — Encounter (HOSPITAL_COMMUNITY): Payer: Self-pay | Admitting: Emergency Medicine

## 2024-02-18 DIAGNOSIS — D62 Acute posthemorrhagic anemia: Secondary | ICD-10-CM

## 2024-02-18 DIAGNOSIS — Z5189 Encounter for other specified aftercare: Secondary | ICD-10-CM

## 2024-02-18 DIAGNOSIS — O074 Failed attempted termination of pregnancy without complication: Secondary | ICD-10-CM

## 2024-02-18 DIAGNOSIS — Z3A08 8 weeks gestation of pregnancy: Secondary | ICD-10-CM | POA: Insufficient documentation

## 2024-02-18 DIAGNOSIS — O4690 Antepartum hemorrhage, unspecified, unspecified trimester: Secondary | ICD-10-CM

## 2024-02-18 DIAGNOSIS — O209 Hemorrhage in early pregnancy, unspecified: Secondary | ICD-10-CM | POA: Diagnosis present

## 2024-02-18 LAB — CBC WITH DIFFERENTIAL/PLATELET
Abs Immature Granulocytes: 0.06 K/uL (ref 0.00–0.07)
Basophils Absolute: 0.1 K/uL (ref 0.0–0.1)
Basophils Relative: 1 %
Eosinophils Absolute: 0 K/uL (ref 0.0–0.5)
Eosinophils Relative: 0 %
HCT: 28.4 % — ABNORMAL LOW (ref 36.0–46.0)
Hemoglobin: 9.2 g/dL — ABNORMAL LOW (ref 12.0–15.0)
Immature Granulocytes: 1 %
Lymphocytes Relative: 17 %
Lymphs Abs: 1.7 K/uL (ref 0.7–4.0)
MCH: 25.9 pg — ABNORMAL LOW (ref 26.0–34.0)
MCHC: 32.4 g/dL (ref 30.0–36.0)
MCV: 80 fL (ref 80.0–100.0)
Monocytes Absolute: 0.8 K/uL (ref 0.1–1.0)
Monocytes Relative: 8 %
Neutro Abs: 7.5 K/uL (ref 1.7–7.7)
Neutrophils Relative %: 73 %
Platelets: 396 K/uL (ref 150–400)
RBC: 3.55 MIL/uL — ABNORMAL LOW (ref 3.87–5.11)
RDW: 17.6 % — ABNORMAL HIGH (ref 11.5–15.5)
WBC: 10.2 K/uL (ref 4.0–10.5)
nRBC: 0 % (ref 0.0–0.2)

## 2024-02-18 LAB — HCG, QUANTITATIVE, PREGNANCY: hCG, Beta Chain, Quant, S: 96671 m[IU]/mL — ABNORMAL HIGH

## 2024-02-18 LAB — ABO/RH: ABO/RH(D): O POS

## 2024-02-18 LAB — HCG, SERUM, QUALITATIVE: Preg, Serum: POSITIVE — AB

## 2024-02-18 NOTE — ED Triage Notes (Signed)
 Pt arrives w/ c/o vaginal bleeding after taking an abortion pill today. Reports she took pills at noon and started having bleeding. Soaked through multiple maxi pads and underwear. Reports feeling dizzy as well.

## 2024-02-19 ENCOUNTER — Encounter (HOSPITAL_COMMUNITY): Payer: Self-pay | Admitting: Obstetrics and Gynecology

## 2024-02-19 ENCOUNTER — Other Ambulatory Visit: Payer: Self-pay

## 2024-02-19 ENCOUNTER — Emergency Department (HOSPITAL_COMMUNITY): Payer: Self-pay

## 2024-02-19 LAB — PREPARE RBC (CROSSMATCH)

## 2024-02-19 LAB — CBC
HCT: 22.6 % — ABNORMAL LOW (ref 36.0–46.0)
HCT: 27 % — ABNORMAL LOW (ref 36.0–46.0)
Hemoglobin: 7.1 g/dL — ABNORMAL LOW (ref 12.0–15.0)
Hemoglobin: 9.1 g/dL — ABNORMAL LOW (ref 12.0–15.0)
MCH: 25.8 pg — ABNORMAL LOW (ref 26.0–34.0)
MCH: 26.9 pg (ref 26.0–34.0)
MCHC: 31.4 g/dL (ref 30.0–36.0)
MCHC: 33.7 g/dL (ref 30.0–36.0)
MCV: 82.2 fL (ref 80.0–100.0)
MCV: 79.9 fL — ABNORMAL LOW (ref 80.0–100.0)
Platelets: 170 K/uL (ref 150–400)
Platelets: 249 K/uL (ref 150–400)
RBC: 2.75 MIL/uL — ABNORMAL LOW (ref 3.87–5.11)
RBC: 3.38 MIL/uL — ABNORMAL LOW (ref 3.87–5.11)
RDW: 17.6 % — ABNORMAL HIGH (ref 11.5–15.5)
RDW: 16.4 % — ABNORMAL HIGH (ref 11.5–15.5)
WBC: 8.9 K/uL (ref 4.0–10.5)
WBC: 10.7 K/uL — ABNORMAL HIGH (ref 4.0–10.5)
nRBC: 0 % (ref 0.0–0.2)
nRBC: 0 % (ref 0.0–0.2)

## 2024-02-19 LAB — WET PREP, GENITAL
Clue Cells Wet Prep HPF POC: NONE SEEN
Sperm: NONE SEEN
Trich, Wet Prep: NONE SEEN
WBC, Wet Prep HPF POC: <10
Yeast Wet Prep HPF POC: NONE SEEN

## 2024-02-19 MED ORDER — SODIUM CHLORIDE 0.9% IV SOLUTION: Freq: Once | INTRAVENOUS | Status: AC

## 2024-02-19 MED ORDER — DIPHENHYDRAMINE HCL 25 MG PO CAPS: 25.0000 mg | ORAL_CAPSULE | Freq: Once | ORAL | Status: DC

## 2024-02-19 MED ORDER — MISOPROSTOL 200 MCG PO TABS
800.0000 ug | ORAL_TABLET | Freq: Once | ORAL | Status: AC
  Administered 2024-02-19: 800 ug via BUCCAL
  Filled 2024-02-19: qty 4

## 2024-02-19 MED ORDER — KETOROLAC TROMETHAMINE 30 MG/ML IJ SOLN
30.0000 mg | Freq: Once | INTRAMUSCULAR | Status: AC
  Administered 2024-02-19: 30 mg via INTRAVENOUS
  Filled 2024-02-19: qty 1

## 2024-02-19 MED ORDER — ONDANSETRON HCL 4 MG/2ML IJ SOLN
4.0000 mg | Freq: Once | INTRAMUSCULAR | Status: AC
  Administered 2024-02-19: 4 mg via INTRAVENOUS
  Filled 2024-02-19: qty 2

## 2024-02-19 MED ORDER — ACETAMINOPHEN 325 MG PO TABS: 650.0000 mg | ORAL_TABLET | ORAL | Status: AC | PRN

## 2024-02-19 MED ORDER — ACETAMINOPHEN 325 MG PO TABS: 650.0000 mg | ORAL_TABLET | Freq: Once | ORAL | Status: DC

## 2024-02-19 MED ORDER — GLYCOPYRROLATE 1 MG PO TABS
1.0000 mg | ORAL_TABLET | Freq: Once | ORAL | Status: AC
  Administered 2024-02-19: 1 mg via ORAL
  Filled 2024-02-19: qty 1

## 2024-02-19 MED ORDER — LACTATED RINGERS IV BOLUS
1000.0000 mL | Freq: Once | INTRAVENOUS | Status: AC
  Administered 2024-02-19: 1000 mL via INTRAVENOUS

## 2024-02-19 MED ORDER — FERROUS SULFATE 325 (65 FE) MG PO TABS: 325.0000 mg | ORAL_TABLET | ORAL | 0 refills | Status: AC

## 2024-02-19 MED ORDER — GLYCOPYRROLATE 0.2 MG/ML IJ SOLN
0.2000 mg | Freq: Once | INTRAMUSCULAR | Status: AC
  Administered 2024-02-19: 0.2 mg via INTRAVENOUS
  Filled 2024-02-19: qty 1

## 2024-02-19 MED ORDER — IBUPROFEN 600 MG PO TABS: 600.0000 mg | ORAL_TABLET | Freq: Four times a day (QID) | ORAL | 0 refills | Status: AC | PRN

## 2024-02-19 NOTE — MAU Provider Note (Cosign Needed Addendum)
 " History     CSN: [id number]  Arrival date and time: 02/18/24 2156   Event Date/Time   First Provider Initiated Contact with Patient 02/19/24 0252      Chief Complaint  Patient presents with   Vaginal Bleeding   Abdominal Pain   HPI Sheri Zimmerman is a 29 y.o. G2P0020 at Unknown who presents with vaginal bleeding & abdominal pain.  Patient transferred from [facility] ED for further management of vaginal bleeding after abortion.  Abortion managed by woman's choice. Was 8 weeks by ultrasound performed by them. Patient took Cytotec  this morning.  Reports heavy bleeding since this afternoon that continued.  Saturated pads over several hours and passed clots.  Took ibuprofen  without relief of symptoms.  Endorses palpitations and dizziness when standing.  OB History     Gravida  2   Para  0   Term  0   Preterm  0   AB  2   Living  0      SAB  1   IAB  1   Ectopic  0   Multiple  0   Live Births  0           Past Medical History:  Diagnosis Date   Asthma     Past Surgical History:  Procedure Laterality Date   TONSILLECTOMY      History reviewed. No pertinent family history.  Social History[1]  Allergies: Allergies[2]  Medications Prior to Admission  Medication Sig Dispense Refill Last Dose/Taking   acetaminophen  (TYLENOL ) 325 MG tablet Take 650 mg by mouth every 3 (three) hours as needed (for hand-related pain).    02/18/2024 at  6:00 PM   ibuprofen  (ADVIL ) 200 MG tablet Take 200 mg by mouth every 6 (six) hours as needed for mild pain (pain score 1-3) or moderate pain (pain score 4-6).   02/18/2024 at  6:00 PM   albuterol  (VENTOLIN  HFA) 108 (90 Base) MCG/ACT inhaler Inhale 2 puffs into the lungs every 6 (six) hours as needed for wheezing or shortness of breath. 8 g 2 Unknown   fluconazole  (DIFLUCAN ) 150 MG tablet Take 1 tablet (150 mg total) by mouth every 3 (three) days. 2 tablet 0 Unknown   nitrofurantoin , macrocrystal-monohydrate, (MACROBID ) 100 MG  capsule Take 1 capsule (100 mg total) by mouth 2 (two) times daily. 10 capsule 0 Unknown    Review of Systems  All other systems reviewed and are negative.  Physical Exam   Blood pressure 105/75, pulse (!) 114, temperature 98.1 F (36.7 C), temperature source Oral, resp. rate 18, height 5' 1 (1.549 m), weight 49.9 kg, last menstrual period 11/17/2023, SpO2 100%. Patient Vitals for the past 24 hrs:  BP Temp Temp src Pulse Resp SpO2 Height Weight  02/19/24 1603 109/66 98.1 F (36.7 C) Oral 81 17 100 % -- --  02/19/24 1530 -- -- -- -- -- 100 % -- --  02/19/24 1525 -- -- -- -- -- 100 % -- --  02/19/24 1515 115/69 -- -- 100 -- -- -- --  02/19/24 1510 -- -- -- -- -- 100 % -- --  02/19/24 1505 -- -- -- -- -- 100 % -- --  02/19/24 1500 122/74 -- -- 79 -- 100 % -- --  02/19/24 1455 -- -- -- -- -- 99 % -- --  02/19/24 1450 -- -- -- -- -- 100 % -- --  02/19/24 1445 122/77 -- -- 76 -- 100 % -- --  02/19/24 1440 -- -- -- -- --  100 % -- --  02/19/24 1430 110/73 -- -- 100 -- -- -- --  02/19/24 1420 -- -- -- -- -- 99 % -- --  02/19/24 1415 (!) 147/97 -- -- 81 -- 99 % -- --  02/19/24 1410 -- -- -- -- -- 98 % -- --  02/19/24 1405 -- -- -- -- -- 98 % -- --  02/19/24 1400 (!) 142/100 -- -- 89 -- 100 % -- --  02/19/24 1355 -- -- -- -- -- 100 % -- --  02/19/24 1350 -- -- -- -- -- 100 % -- --  02/19/24 1345 (!) 141/98 -- -- 96 -- 98 % -- --  02/19/24 1335 -- -- -- -- -- 96 % -- --  02/19/24 1330 (!) 141/97 -- -- (!) 103 -- 98 % -- --  02/19/24 1325 -- -- -- -- -- 99 % -- --  02/19/24 1320 -- -- -- -- -- 100 % -- --  02/19/24 1315 (!) 140/101 -- -- 97 -- 99 % -- --  02/19/24 1300 (!) 145/101 97.9 F (36.6 C) Axillary (!) 113 16 -- -- --  02/19/24 1254 132/88 -- -- (!) 125 -- -- -- --  02/19/24 1226 (!) 146/109 -- -- (!) 107 -- -- -- --  02/19/24 1022 120/73 98.1 F (36.7 C) Axillary 72 17 100 % -- --  02/19/24 1016 123/78 -- -- (!) 111 -- -- -- --  02/19/24 1013 101/72 -- -- 82 -- -- -- --   02/19/24 1010 108/67 -- -- 67 -- -- -- --  02/19/24 0904 (!) 100/48 98.3 F (36.8 C) Oral 66 16 100 % -- --  02/19/24 0800 117/60 98 F (36.7 C) -- 72 16 100 % -- --  02/19/24 0758 117/60 98 F (36.7 C) -- 72 16 100 % -- --  02/19/24 0734 (!) 106/55 98.3 F (36.8 C) Oral 75 16 100 % -- --  02/19/24 0602 102/60 -- -- (!) 133 -- -- -- --  02/19/24 0226 105/75 98.1 F (36.7 C) Oral (!) 114 18 100 % 5' 1 (1.549 m) 49.9 kg  02/19/24 0150 99/75 98.1 F (36.7 C) -- 94 18 100 % -- --  02/19/24 0144 -- -- -- 98 -- 100 % -- --  02/18/24 2204 104/69 98 F (36.7 C) Oral (!) 120 18 100 % -- --     Physical Exam Vitals and nursing note reviewed. Exam conducted with a chaperone present.  Constitutional:      General: She is not in acute distress.    Appearance: She is well-developed. She is not ill-appearing.  HENT:     Head: Normocephalic and atraumatic.  Eyes:     General: No scleral icterus.       Right eye: No discharge.        Left eye: No discharge.     Conjunctiva/sclera: Conjunctivae normal.  Pulmonary:     Effort: Pulmonary effort is normal. No respiratory distress.  Genitourinary:    Comments: Pelvic: moderate amount of blood, clot, & tissue removed from vagina & os. After removal, os appears to be closed with no current active bleeding.  Neurological:     General: No focal deficit present.     Mental Status: She is alert.  Psychiatric:        Mood and Affect: Mood normal.        Behavior: Behavior normal.     MAU Course  Procedures Results for orders placed or performed during the  hospital encounter of 02/18/24 (from the past 24 hours)  CBC with Differential/Platelet     Status: Abnormal   Collection Time: 02/18/24 10:21 PM  Result Value Ref Range   WBC 10.2 4.0 - 10.5 K/uL   RBC 3.55 (L) 3.87 - 5.11 MIL/uL   Hemoglobin 9.2 (L) 12.0 - 15.0 g/dL   HCT 71.5 (L) 63.9 - 53.9 %   MCV 80.0 80.0 - 100.0 fL   MCH 25.9 (L) 26.0 - 34.0 pg   MCHC 32.4 30.0 - 36.0 g/dL    RDW 82.3 (H) 88.4 - 15.5 %   Platelets 396 150 - 400 K/uL   nRBC 0.0 0.0 - 0.2 %   Neutrophils Relative % 73 %   Neutro Abs 7.5 1.7 - 7.7 K/uL   Lymphocytes Relative 17 %   Lymphs Abs 1.7 0.7 - 4.0 K/uL   Monocytes Relative 8 %   Monocytes Absolute 0.8 0.1 - 1.0 K/uL   Eosinophils Relative 0 %   Eosinophils Absolute 0.0 0.0 - 0.5 K/uL   Basophils Relative 1 %   Basophils Absolute 0.1 0.0 - 0.1 K/uL   Immature Granulocytes 1 %   Abs Immature Granulocytes 0.06 0.00 - 0.07 K/uL  hCG, serum, qualitative     Status: Abnormal   Collection Time: 02/18/24 10:21 PM  Result Value Ref Range   Preg, Serum POSITIVE (A) NEGATIVE  hCG, quantitative, pregnancy     Status: Abnormal   Collection Time: 02/18/24 10:21 PM  Result Value Ref Range   hCG, Beta Chain, Quant, S 96,671 (H) <5 mIU/mL  ABO/Rh     Status: None   Collection Time: 02/18/24 10:21 PM  Result Value Ref Range   ABO/RH(D)      O POS Performed at [facility], [address], [place], [place] [address]   Type and screen     Status: None (Preliminary result)   Collection Time: 02/19/24  5:11 AM  Result Value Ref Range   ABO/RH(D) O POS    Antibody Screen NEG    Sample Expiration 02/22/2024,2359    Unit Number [id number]    Blood Component Type RBC LR PHER1    Unit division 00    Status of Unit ISSUED    Transfusion Status OK TO TRANSFUSE    Crossmatch Result      Compatible Performed at [facility], [address] [place], [place] [address]   CBC     Status: Abnormal   Collection Time: 02/19/24  5:22 AM  Result Value Ref Range   WBC 8.9 4.0 - 10.5 K/uL   RBC 2.75 (L) 3.87 - 5.11 MIL/uL   Hemoglobin 7.1 (L) 12.0 - 15.0 g/dL   HCT 77.3 (L) 63.9 - 53.9 %   MCV 82.2 80.0 - 100.0 fL   MCH 25.8 (L) 26.0 - 34.0 pg   MCHC 31.4 30.0 - 36.0 g/dL   RDW 82.3 (H) 88.4 - 84.4 %   Platelets 170 150 - 400 K/uL   nRBC 0.0 0.0 - 0.2 %  Wet prep, genital     Status: None   Collection Time: 02/19/24   5:23 AM  Result Value Ref Range   Yeast Wet Prep HPF POC NONE SEEN NONE SEEN   Trich, Wet Prep NONE SEEN NONE SEEN   Clue Cells Wet Prep HPF POC NONE SEEN NONE SEEN   WBC, Wet Prep HPF POC <10 <10   Sperm NONE SEEN   Prepare RBC (crossmatch)  Status: None   Collection Time: 02/19/24  6:20 AM  Result Value Ref Range   Order Confirmation      ORDER PROCESSED BY BLOOD BANK Performed at [facility], [address] [place], [place] [address]    US  OB Transvaginal Result Date: 02/19/2024 EXAM: OBSTETRIC ULTRASOUND FIRST TRIMESTER TECHNIQUE: Transvaginal first trimester obstetric pelvic duplex ultrasound was performed with real-time imaging, color flow Doppler imaging, and spectral analysis. COMPARISON: None available. CLINICAL HISTORY: 29 year old female with heavy vaginal bleeding and passing clots status post medical abortion recently initiated. Quantitative beta human chorionic gonadotropin  yesterday was 96,671. FINDINGS: UTERUS: Thickened and heterogeneous endometrium measures up to 25 mm (series 1 image 7) with some heterogeneous hypervascularity. Thickened and vascular endometrium also demonstrated on image 16 of series 1. Likely with retained products of conception. No focal myometrial mass. GESTATIONAL SAC(S): No intrauterine gestational sac identified. No subchorionic hemorrhage. YOLK SAC: Not identified. EMBRYO(<11WK) /FETUS(>=11WK): Not identified. CROWN RUMP LENGTH: Not applicable RATE OF CARDIAC ACTIVITY: Not applicable RIGHT OVARY: Right ovary measures 3.3 x 1.8 x 1.7 cm, estimated volume 7 mL. The right ovary appears normal. Normal arterial and venous flow. LEFT OVARY: Left ovary measures 4.5 x 1.3 x 1.8 cm, estimated volume 7 mL. The left ovary appears normal and likely contains the corpus luteum (series 1 image 30). Normal arterial and venous flow. FREE FLUID: Only trace simple appearing free fluid in the cul de sac. MEASUREMENTS ESTIMATED GESTATIONAL AGE BY CURRENT ULTRASOUND:  Not applicable. ESTIMATED GESTATIONAL AGE BY LMP/PRIOR ULTRASOUND: Not provided. ESTIMATED DUE DATE: Not applicable. IMPRESSION: 1. No intrauterine gestational sac identified. Thickened, heterogeneous, and hypervascular endometrium (25 mm), likely reflecting retained products of conception. 2. Ovaries appear normal. Only trace simple appearing pelvic free fluid . Electronically signed by: Helayne Hurst MD MD 02/19/2024 05:13 AM EST RP Workstation: [id number]    MDM -Patient presents for vaginal bleeding after taking medication for abortion earlier today.  Moderate blood and tissue removed from vagina during exam.  Patient remains orthostatic therefore labs and imaging and IV fluids ordered.  After fluid bolus patient remains orthostatic.  Hemoglobin has dropped to 7.1 from 9.2 earlier this evening.  Reviewed with Dr. Zina, will give 1 unit of packed red blood cells and reassess.  Ultrasound concerning for retained POC's.   Care turned over to Dvontae Ruan  [facility] [place] 02/19/2024 8:05 AM   1030: Patient response reports only small amount of bleeding in the past few hours. Blood transfusion complete. Reports feeling much better after blood transfusion.  Denies dizziness.  Mild orthostatic changes (increase in heart rate, stable BP).  Will give second liter of LR for volume replacement and check posttransfusion CBC. Discussed ultrasound showing retained products of conception and discussed management options including expectant management, additional Cytotec  and D&C with pt. Discussed Hx, exam, MAU course with Dr. Eveline. He does not perform MVAs.  He reviewed ultrasound and came to bedside to discuss risks and benefits of options with patient.  Patient prefers to proceed with second Cytotec  dose.  Will observe for at least 2 hours afterward to monitor for heavy bleeding.   1500: Pt anxious to be discharged. Denies dizziness. No longer orthostatic. Few elevated BPs and tachycardia several hours after  transfusion completed, but pt denied pain, bleeding or other new Sx. Resolved spontaneously. Discharged with strict return precautions.  Rx PO iron. Iron rich diet. Will scheduled [facility]t [facility]   Assessment and Plan   1. Vaginal  bleeding in pregnancy   2. Retained products of conception after induced termination of pregnancy   3. Encounter for blood transfusion   4. Anemia due to acute blood loss    D/C home in stale condition Return to MAU for heavy bleeding, fever, abd pain not controlled w/ IBU Pelvic rest until no further bleeding  Follow-up Information     [facility] Follow up.   Specialty: Obstetrics and Gynecology Why: Will call to schedule follow-up appointment and ultrasound in 2 weeks. Contact information: [address] [place]  [address] [phone]        [facility] Follow up.   Specialty: Obstetrics and Gynecology Why: As needed in emergencies Contact information: [address] [place]  [phone]               Allergies as of 02/19/2024   No Known Allergies      Medication List     STOP taking these medications    fluconazole  150 MG tablet Commonly known as: Diflucan    nitrofurantoin  (macrocrystal-monohydrate) 100 MG capsule Commonly known as: MACROBID        TAKE these medications    acetaminophen  325 MG tablet Commonly known as: TYLENOL  Take 2 tablets (650 mg total) by mouth every 4 (four) hours as needed (for hand-related pain). What changed: when to take this   albuterol  108 (90 Base) MCG/ACT inhaler Commonly known as: VENTOLIN  HFA Inhale 2 puffs into the lungs every 6 (six) hours as needed for wheezing or shortness of breath.   ferrous sulfate  325 (65 FE) MG tablet Take 1 tablet (325 mg total) by mouth every other day.   ibuprofen  600 MG tablet Commonly known as: ADVIL  Take  1 tablet (600 mg total) by mouth every 6 (six) hours as needed for moderate pain (pain score 4-6). What changed:  medication strength how much to take reasons to take this       Marquarius Lofton  Claudene HOWARD 02/19/2024 9:33 PM      [1]  Social History Tobacco Use   Smoking status: Never   Smokeless tobacco: Never  Vaping Use   Vaping status: Never Used  Substance Use Topics   Alcohol use: Not Currently   Drug use: Never  [2] No Known Allergies  "

## 2024-02-19 NOTE — ED Notes (Addendum)
 Transfer process verbalized.   Provided instructions and directions to destination.   Alert, oriented and assisted to vehicle via wheelchair.   Stable condition displays no signs of distress.   Has a safe ride present.

## 2024-02-19 NOTE — Discharge Instructions (Addendum)
 Retained Products of Conception: What to Know How is this diagnosed? This condition may be diagnosed based on: A physical exam. Ultrasound. Blood tests. How is this treated? It may be treated with: Expectant management (waiting for tissue to pass on it's own) Dilation and curettage (D&C). Medicines. These may include: Medicine to help any remaining pregnancy tissue come out of the uterus. Medicine to reduce the size of the uterus. These medicines may be given if there's a lot of bleeding. If you have Rh-negative blood, you may be given a shot of Rho(D) immune globulin to help prevent problems in future pregnancies. Follow these instructions at home: Medicines Take your medicines only as told. General instructions Monitor how much tissue or blood comes out of the vagina. Do not have sex, douche, or put anything in your vagina, such as tampons, until your bleeding has stopped. Keep all follow-up visits. Your provider will make sure you're healing well and that you're not having any problems. Get help right away if: You have very bad cramps or pain in your back or belly that don/t improve with pain medicine. You have a fever or chills. You have heavy bleeding that soaks through 2 large pads an hour for more than 2 hours. You become light-headed, weak, or faint.  These symptoms may be an emergency. Take one of these steps right away: Go to Maternity Assessment Unit at [facility] entrance C. Call 911.

## 2024-02-19 NOTE — ED Provider Notes (Signed)
 " [facility]-EM[facility]ENC[facility] DEPT [facility] Emergency Department Provider Note MRN:  [id number]  Arrival date & time: 02/19/2024     Chief Complaint   Vaginal Bleeding   History of Present Illness   Sheri Zimmerman is a 29 y.o. year-old female presents to the ED with chief complaint of heavy vaginal bleeding.  States that she took an abortion pill this morning.  States that she has been feeling many pads with blood.  She states that she was told to come in if she was having excessive bleeding.  She reports associated lower abdominal cramping.  She states that she is still bleeding now.  She was about [redacted] weeks along prior to abortion.  Abortion managed by women's choice.  History provided by patient.   Review of Systems  Pertinent positive and negative review of systems noted in HPI.    Physical Exam   Vitals:   02/19/24 0144 02/19/24 0150  BP:  99/75  Pulse: 98 94  Resp:  18  Temp:  98.1 F (36.7 C)  SpO2: 100% 100%    CONSTITUTIONAL:  non toxic-appearing, NAD NEURO:  Alert and oriented x 3, CN 3-12 grossly intact EYES:  eyes equal and reactive ENT/NECK:  Supple, no stridor  CARDIO:  tachycardic, regular rhythm, appears well-perfused  PULM:  No respiratory distress,  GI/GU:  non-distended,  MSK/SPINE:  No gross deformities, no edema, moves all extremities  SKIN:  no rash, atraumatic   *Additional and/or pertinent findings included in MDM below  Diagnostic and Interventional Summary    EKG Interpretation Date/Time:    Ventricular Rate:    PR Interval:    QRS Duration:    QT Interval:    QTC Calculation:   R Axis:      Text Interpretation:         Labs Reviewed  CBC WITH DIFFERENTIAL/PLATELET - Abnormal; Notable for the following components:      Result Value   RBC 3.55 (*)    Hemoglobin 9.2 (*)    HCT 28.4 (*)    MCH 25.9 (*)    RDW 17.6 (*)    All other components within normal limits  HCG, SERUM, QUALITATIVE - Abnormal; Notable for the following  components:   Preg, Serum POSITIVE (*)    All other components within normal limits  HCG, QUANTITATIVE, PREGNANCY - Abnormal; Notable for the following components:   hCG, Beta Chain, Quant, S 96,671 (*)    All other components within normal limits  URINALYSIS, ROUTINE W REFLEX MICROSCOPIC  ABO/RH    No orders to display    Medications - No data to display   Procedures  /  Critical Care Procedures  ED Course and Medical Decision Making  I have reviewed the triage vital signs, the nursing notes, and pertinent available records from the EMR.  Social Determinants Affecting Complexity of Care: Patient has no clinically significant social determinants affecting this chief complaint..   ED Course:    Medical Decision Making Patient here with vaginal bleeding following abortion pill.  Still bleeding now.  Hemoglobin is 9.2, I have nothing to compare this to.  She was initially tachycardic to the 120s, but I suspect this is more anxiety driven.  I called and consulted with OB/GYN, who is agreeable with plan for transfer for further evaluation at MAU.  Patient does appear stable for transfer.  She will transfer POV, her aunt will drive her.  Amount and/or Complexity of Data Reviewed Labs: ordered.  Risk Decision regarding  hospitalization.         Consultants: I consulted with Dr. Zina, who recommends transfer to MAU for further evaluation.   Treatment and Plan: Patient transferred to MAU.    Final Clinical Impressions(s) / ED Diagnoses     ICD-10-CM   1. Vaginal bleeding in pregnancy  O46.90       ED Discharge Orders     None         Discharge Instructions Discussed with and Provided to Patient:   Discharge Instructions   None      Vicky Charleston, PA-C 02/19/24 0200    Trine Raynell Moder, MD 02/19/24 [phone]  "

## 2024-02-19 NOTE — ED Notes (Addendum)
 While moving a different pt from the waiting room, family member of this Pt became upset. Family member stated that I had called someone that had arrived after the Pt. Attempted to explain to family member that the ED was not on a first come first serve basis but based on accuity, however I was interupted by the Pts family memebr who stated it was until it got to her name. Family memebr then stated she wanted to know how long it was going to be or she was going to take the Pt somewhere else.  Family member was advised that there was no standard wait time I could give her, as there was no definitive order of patients in the ED. Family member became angry with me and stated well, can you go find me someone who can give me some answers then.

## 2024-02-19 NOTE — MAU Note (Signed)
 RN out to lobby to call patient back for triage. Patient's aunt asked RN what room she was going to. RN informed family member that she was taking the patient to triage first. Family member raised voice at [facility] and said that the other hospital told her she would not have to wait. She then spoke over RN and said that she was planning on leaving, but wanted to know where the patient was going first. RN repeated that she was going to triage first. Patient's family member then told patient that she would wait. RN took patient to triage and explained that the charge nurse will triage patients and determine where they should go based on triage. RN asked patient if she had any concerns with her care and patient stated no, that her aunt was just frustrated with the wait times in the ED.   MAU Triage Note: Sheri Zimmerman is a 29 y.o. at Unknown here in MAU reporting: transferred from [facility] via private vehicle for evaluation of vaginal bleeding after taking cytotec  around 1130. She reports changing pads every 45 minutes-1 hour and is saturating the pads. She reports passing some large clots when she first began bleeding, but denies any since. Does report some abdominal cramping.    Patient complaint: took abortion pill heavy vaginal bleeding  Pain Score: 4  Pain Location: Abdomen     Onset of complaint: today LMP: Patient's last menstrual period was 11/17/2023 (approximate).  Vitals:   02/19/24 0144 02/19/24 0150  BP:  99/75  Pulse: 98 94  Resp:  18  Temp:  98.1 F (36.7 C)  SpO2: 100% 100%    Lab orders placed from triage: N/A, orders in prior to triage

## 2024-02-19 NOTE — ED Notes (Signed)
 Pts visitor states that she should of just taken the pt to [facility] because this hospital is stressing her out. Upset about wait time, wanting to report first look paramedic.

## 2024-02-20 ENCOUNTER — Encounter: Payer: Self-pay | Admitting: Advanced Practice Midwife

## 2024-02-20 ENCOUNTER — Telehealth: Payer: Self-pay | Admitting: Family Medicine

## 2024-02-20 LAB — BPAM RBC
Blood Product Expiration Date: 202602032359
ISSUE DATE / TIME: 202601110721
Unit Type and Rh: 5100

## 2024-02-20 LAB — TYPE AND SCREEN
ABO/RH(D): O POS
Antibody Screen: NEGATIVE
Unit division: 0

## 2024-02-20 LAB — GC/CHLAMYDIA PROBE AMP (~~LOC~~) NOT AT ARMC
Chlamydia: NEGATIVE
Comment: NEGATIVE
Comment: NORMAL
Neisseria Gonorrhea: NEGATIVE

## 2024-02-20 NOTE — Telephone Encounter (Signed)
 Called patient to schedule an appointment for a follow up Ultrasound at our office. Patient did not answer the call so I left a detailed message informing her that an appointment was made for 02/27/24 at 1:55 pm. I also sent out a letter reminder via mail.

## 2024-02-23 ENCOUNTER — Other Ambulatory Visit: Payer: Self-pay | Admitting: *Deleted

## 2024-02-23 DIAGNOSIS — O4690 Antepartum hemorrhage, unspecified, unspecified trimester: Secondary | ICD-10-CM

## 2024-02-23 DIAGNOSIS — O074 Failed attempted termination of pregnancy without complication: Secondary | ICD-10-CM

## 2024-02-27 ENCOUNTER — Other Ambulatory Visit: Payer: Self-pay

## 2024-02-27 ENCOUNTER — Telehealth: Payer: Self-pay | Admitting: Family Medicine

## 2024-02-27 NOTE — Telephone Encounter (Signed)
 Called patient to inform her that she missed her Ultrasound appointment with us  today at 1:55. Patient did not answer the call and her Voice Mailbox was not set up.

## 2024-03-01 ENCOUNTER — Encounter: Payer: Self-pay | Admitting: Obstetrics and Gynecology
